# Patient Record
Sex: Male | Born: 1955 | Race: White | Hispanic: No | Marital: Married | Smoking: Never smoker
Health system: Southern US, Community
[De-identification: ages and names within clinical notes are randomized; demographics above are authoritative.]

## PROBLEM LIST (undated history)

## (undated) DIAGNOSIS — K259 Gastric ulcer, unspecified as acute or chronic, without hemorrhage or perforation: Secondary | ICD-10-CM

## (undated) DIAGNOSIS — E785 Hyperlipidemia, unspecified: Secondary | ICD-10-CM

## (undated) DIAGNOSIS — K449 Diaphragmatic hernia without obstruction or gangrene: Secondary | ICD-10-CM

## (undated) DIAGNOSIS — I10 Essential (primary) hypertension: Secondary | ICD-10-CM

## (undated) DIAGNOSIS — I471 Supraventricular tachycardia, unspecified: Secondary | ICD-10-CM

## (undated) DIAGNOSIS — M1990 Unspecified osteoarthritis, unspecified site: Secondary | ICD-10-CM

## (undated) DIAGNOSIS — I4891 Unspecified atrial fibrillation: Secondary | ICD-10-CM

## (undated) DIAGNOSIS — F419 Anxiety disorder, unspecified: Secondary | ICD-10-CM

## (undated) DIAGNOSIS — R002 Palpitations: Secondary | ICD-10-CM

## (undated) DIAGNOSIS — Z8619 Personal history of other infectious and parasitic diseases: Secondary | ICD-10-CM

## (undated) DIAGNOSIS — K5792 Diverticulitis of intestine, part unspecified, without perforation or abscess without bleeding: Secondary | ICD-10-CM

## (undated) HISTORY — DX: Unspecified atrial fibrillation: I48.91

## (undated) HISTORY — DX: Gastric ulcer, unspecified as acute or chronic, without hemorrhage or perforation: K25.9

## (undated) HISTORY — DX: Essential (primary) hypertension: I10

## (undated) HISTORY — DX: Diverticulitis of intestine, part unspecified, without perforation or abscess without bleeding: K57.92

## (undated) HISTORY — DX: Anxiety disorder, unspecified: F41.9

## (undated) HISTORY — DX: Hyperlipidemia, unspecified: E78.5

## (undated) HISTORY — PX: SHOULDER SURGERY: SHX246

## (undated) HISTORY — DX: Palpitations: R00.2

## (undated) HISTORY — DX: Diaphragmatic hernia without obstruction or gangrene: K44.9

## (undated) HISTORY — DX: Supraventricular tachycardia: I47.1

## (undated) HISTORY — DX: Supraventricular tachycardia, unspecified: I47.10

## (undated) HISTORY — DX: Unspecified osteoarthritis, unspecified site: M19.90

## (undated) HISTORY — DX: Personal history of other infectious and parasitic diseases: Z86.19

## (undated) HISTORY — PX: KNEE SURGERY: SHX244

## (undated) HISTORY — PX: SPLENECTOMY: SUR1306

---

## 1999-04-12 ENCOUNTER — Emergency Department (HOSPITAL_COMMUNITY): Admission: EM | Admit: 1999-04-12 | Discharge: 1999-04-12 | Payer: Self-pay | Admitting: Emergency Medicine

## 2004-09-04 ENCOUNTER — Emergency Department (HOSPITAL_COMMUNITY): Admission: EM | Admit: 2004-09-04 | Discharge: 2004-09-05 | Payer: Self-pay | Admitting: Emergency Medicine

## 2010-03-16 ENCOUNTER — Ambulatory Visit: Payer: Self-pay | Admitting: Cardiology

## 2010-03-27 ENCOUNTER — Ambulatory Visit: Payer: Self-pay | Admitting: Cardiology

## 2010-03-27 ENCOUNTER — Ambulatory Visit (HOSPITAL_COMMUNITY): Admission: RE | Admit: 2010-03-27 | Discharge: 2010-03-27 | Payer: Self-pay | Admitting: Cardiology

## 2010-04-17 ENCOUNTER — Ambulatory Visit: Payer: Self-pay | Admitting: Cardiology

## 2010-05-28 ENCOUNTER — Encounter
Admission: RE | Admit: 2010-05-28 | Discharge: 2010-05-28 | Payer: Self-pay | Admitting: Physical Medicine and Rehabilitation

## 2010-10-21 ENCOUNTER — Other Ambulatory Visit: Payer: Self-pay | Admitting: Cardiology

## 2010-10-21 DIAGNOSIS — I471 Supraventricular tachycardia: Secondary | ICD-10-CM

## 2010-10-22 NOTE — Telephone Encounter (Signed)
escribe medication per fax request  

## 2011-03-01 ENCOUNTER — Encounter: Payer: Self-pay | Admitting: *Deleted

## 2011-03-01 DIAGNOSIS — R002 Palpitations: Secondary | ICD-10-CM | POA: Insufficient documentation

## 2011-03-01 DIAGNOSIS — M1990 Unspecified osteoarthritis, unspecified site: Secondary | ICD-10-CM | POA: Insufficient documentation

## 2011-03-01 DIAGNOSIS — Z8619 Personal history of other infectious and parasitic diseases: Secondary | ICD-10-CM | POA: Insufficient documentation

## 2011-03-01 DIAGNOSIS — K449 Diaphragmatic hernia without obstruction or gangrene: Secondary | ICD-10-CM | POA: Insufficient documentation

## 2011-03-01 DIAGNOSIS — K5792 Diverticulitis of intestine, part unspecified, without perforation or abscess without bleeding: Secondary | ICD-10-CM | POA: Insufficient documentation

## 2011-03-01 DIAGNOSIS — I471 Supraventricular tachycardia: Secondary | ICD-10-CM | POA: Insufficient documentation

## 2011-03-15 ENCOUNTER — Encounter: Payer: Self-pay | Admitting: Cardiology

## 2011-03-15 ENCOUNTER — Ambulatory Visit (INDEPENDENT_AMBULATORY_CARE_PROVIDER_SITE_OTHER): Payer: 59 | Admitting: Cardiology

## 2011-03-15 VITALS — BP 134/82 | HR 60 | Ht 67.0 in | Wt 157.6 lb

## 2011-03-15 DIAGNOSIS — I471 Supraventricular tachycardia: Secondary | ICD-10-CM

## 2011-03-15 DIAGNOSIS — R002 Palpitations: Secondary | ICD-10-CM

## 2011-03-15 DIAGNOSIS — I498 Other specified cardiac arrhythmias: Secondary | ICD-10-CM

## 2011-03-15 MED ORDER — METOPROLOL SUCCINATE ER 25 MG PO TB24: 25.0000 mg | ORAL_TABLET | Freq: Every day | ORAL | Status: DC

## 2011-03-15 NOTE — Progress Notes (Signed)
   Adrian Rose Date of Birth: 1956/04/03   History of Present Illness: Adrian Rose is seen for yearly followup. He has made significant lifestyle modifications this year. He has been walking 1-1/2 miles per day in 30 minutes. He is eating much better. He has lost 13 pounds. His blood pressure at home has been under excellent control. He still notes some palpitations but has had no significant tachycardia.  Current Outpatient Prescriptions on File Prior to Visit  Medication Sig Dispense Refill  . ALPRAZolam (XANAX) 0.25 MG tablet Take 0.25 mg by mouth at bedtime as needed.        . Chlorpheniramine Maleate (ALLERGY RELIEF PO) Take by mouth.        Marland Kitchen GLUCOSAMINE PO Take by mouth 2 (two) times daily.        . Multiple Vitamin (MULTIVITAMIN) tablet Take 1 tablet by mouth daily.        Marland Kitchen OMEPRAZOLE PO Take by mouth. OTC daily       . zolpidem (AMBIEN) 10 MG tablet Take 10 mg by mouth at bedtime as needed.        Marland Kitchen DISCONTD: metoprolol succinate (TOPROL-XL) 25 MG 24 hr tablet TAKE 1 TABLET DAILY.  30 tablet  5    No Known Allergies  Past Medical History  Diagnosis Date  . SVT (supraventricular tachycardia)     hx of  . Diverticulitis   . Hiatal hernia   . Arthritis   . History of scarlet fever   . Palpitations     Past Surgical History  Procedure Date  . Knee surgery     arthroscopic left knee  . Shoulder surgery     bilateral  . Splenectomy     History  Smoking status  . Never Smoker   Smokeless tobacco  . Not on file    History  Alcohol Use No    Family History  Problem Relation Age of Onset  . Hypertension Father     Review of Systems: As per history of present illness.  All other systems were reviewed and are negative.  Physical Exam: BP 134/82  Pulse 60  Ht 5\' 7"  (1.702 m)  Wt 157 lb 9.6 oz (71.487 kg)  BMI 24.68 kg/m2 The patient is alert and oriented x 3.  The mood and affect are normal.  The skin is warm and dry.  Color is normal.  The HEENT exam  reveals that the sclera are nonicteric.  The mucous membranes are moist.  The carotids are 2+ without bruits.  There is no thyromegaly.  There is no JVD.  The lungs are clear.  The chest wall is non tender.  The heart exam reveals a regular rate with a normal S1 and S2.  There are no murmurs, gallops, or rubs.  The PMI is not displaced.   Abdominal exam reveals good bowel sounds.  There is no guarding or rebound.  There is no hepatosplenomegaly or tenderness.  There are no masses.  Exam of the legs reveal no clubbing, cyanosis, or edema.  The legs are without rashes.  The distal pulses are intact.  Cranial nerves II - XII are intact.  Motor and sensory functions are intact.  The gait is normal.  LABORATORY DATA:   Assessment / Plan:

## 2011-03-15 NOTE — Patient Instructions (Signed)
Continue your current therapy  I will see you again in 1 year.   

## 2011-03-15 NOTE — Assessment & Plan Note (Signed)
His tachycardia is well controlled on low-dose metoprolol. He still has some palpitations consistent with PACs or PVCs. He has made significant lifestyle modifications. We will continue with his current therapy and followup again in one year.

## 2011-05-07 ENCOUNTER — Other Ambulatory Visit: Payer: Self-pay | Admitting: Cardiology

## 2011-05-19 ENCOUNTER — Other Ambulatory Visit: Payer: Self-pay | Admitting: Cardiology

## 2011-11-13 ENCOUNTER — Other Ambulatory Visit: Payer: Self-pay | Admitting: Cardiology

## 2011-11-13 MED ORDER — METOPROLOL SUCCINATE ER 25 MG PO TB24: 25.0000 mg | ORAL_TABLET | Freq: Every day | ORAL | Status: DC

## 2011-11-27 ENCOUNTER — Telehealth: Payer: Self-pay

## 2011-11-27 MED ORDER — METOPROLOL SUCCINATE ER 25 MG PO TB24: 25.0000 mg | ORAL_TABLET | Freq: Every day | ORAL | Status: DC

## 2011-11-27 NOTE — Telephone Encounter (Signed)
Patient called was told received fax from [facility] [address] [facility] Levan to refill metoprolol.Prescription sent in for a 90 day supply with 3 refills.

## 2012-05-12 ENCOUNTER — Other Ambulatory Visit: Payer: Self-pay | Admitting: *Deleted

## 2012-05-12 MED ORDER — METOPROLOL SUCCINATE ER 25 MG PO TB24: 25.0000 mg | ORAL_TABLET | Freq: Every day | ORAL | Status: DC

## 2012-07-01 HISTORY — PX: EAR CANALOPLASTY: SHX1481

## 2012-12-06 ENCOUNTER — Other Ambulatory Visit: Payer: Self-pay | Admitting: Cardiology

## 2013-02-07 ENCOUNTER — Other Ambulatory Visit: Payer: Self-pay | Admitting: Cardiology

## 2013-03-07 ENCOUNTER — Other Ambulatory Visit: Payer: Self-pay | Admitting: Cardiology

## 2013-04-07 ENCOUNTER — Other Ambulatory Visit: Payer: Self-pay | Admitting: Cardiology

## 2013-04-19 ENCOUNTER — Other Ambulatory Visit: Payer: Self-pay | Admitting: *Deleted

## 2013-04-19 MED ORDER — METOPROLOL SUCCINATE ER 25 MG PO TB24: ORAL_TABLET | ORAL | Status: DC

## 2013-05-14 ENCOUNTER — Other Ambulatory Visit: Payer: Self-pay

## 2013-05-14 ENCOUNTER — Encounter (INDEPENDENT_AMBULATORY_CARE_PROVIDER_SITE_OTHER): Payer: Self-pay

## 2013-05-14 ENCOUNTER — Encounter: Payer: Self-pay | Admitting: Cardiology

## 2013-05-14 ENCOUNTER — Ambulatory Visit (INDEPENDENT_AMBULATORY_CARE_PROVIDER_SITE_OTHER): Payer: BC Managed Care – PPO | Admitting: Cardiology

## 2013-05-14 VITALS — BP 126/72 | HR 57 | Ht 67.0 in | Wt 158.0 lb

## 2013-05-14 DIAGNOSIS — I498 Other specified cardiac arrhythmias: Secondary | ICD-10-CM

## 2013-05-14 DIAGNOSIS — I471 Supraventricular tachycardia: Secondary | ICD-10-CM

## 2013-05-14 MED ORDER — METOPROLOL SUCCINATE ER 25 MG PO TB24: ORAL_TABLET | ORAL | Status: DC

## 2013-05-14 NOTE — Patient Instructions (Signed)
Continue your current therapy  I will see you in one year   

## 2013-05-14 NOTE — Progress Notes (Signed)
   Adrian Rose Date of Birth: 04-23-1956 Medical Record #[id number]  History of Present Illness: Adrian Rose is seen today for followup. He has a history of supraventricular tachycardia. This has been managed with beta blocker therapy. He has done very well over the past year and a half without significant tachycardia. He still experiences palpitations at times at night but these feel more like a skipped beat. He may take an extra half dose of generic metoprolol and settles down within several minutes. He is walking more and has lost 7 pounds.  Current Outpatient Prescriptions on File Prior to Visit  Medication Sig Dispense Refill  . ALPRAZolam (XANAX) 0.25 MG tablet Take 0.25 mg by mouth at bedtime as needed.        . Chlorpheniramine Maleate (ALLERGY RELIEF PO) Take by mouth.        Marland Kitchen GLUCOSAMINE PO Take by mouth 2 (two) times daily.        . metoprolol succinate (TOPROL-XL) 25 MG 24 hr tablet TAKE 1 TABLET EVERY DAY  30 tablet  0  . Multiple Vitamin (MULTIVITAMIN) tablet Take 1 tablet by mouth daily.        Marland Kitchen OMEPRAZOLE PO Take by mouth. OTC daily       . zolpidem (AMBIEN) 10 MG tablet Take 10 mg by mouth at bedtime as needed.         No current facility-administered medications on file prior to visit.    No Known Allergies  Past Medical History  Diagnosis Date  . SVT (supraventricular tachycardia)     hx of  . Diverticulitis   . Hiatal hernia   . Arthritis   . History of scarlet fever   . Palpitations     Past Surgical History  Procedure Laterality Date  . Knee surgery      arthroscopic left knee  . Shoulder surgery      bilateral  . Splenectomy      History  Smoking status  . Never Smoker   Smokeless tobacco  . Not on file    History  Alcohol Use No    Family History  Problem Relation Age of Onset  . Hypertension Father     Review of Systems: The review of systems is positive for chronic knee pain.  All other systems were reviewed and are  negative.  Physical Exam: BP 126/72  Pulse 57  Ht 5\' 7"  (1.702 m)  Wt 158 lb (71.668 kg)  BMI 24.74 kg/m2 He is a pleasant white male in no acute distress. HEENT: Normal Neck: No JVD or bruits. No thyromegaly. Lungs: Clear Cardiovascular: Regular rate and rhythm. Normal S1 and S2. No gallop or murmur. Abdomen: Soft and nontender. Extremities: No edema. Pulses are 2+. Neuro: Alert and oriented x3. Cranial nerves II through XII are intact.  LABORATORY DATA: ECG shows sinus bradycardia with a rate of 57 beats per minute. There is a minor nonspecific ST abnormality.  Laboratory data on 05/07/2013 included normal CBC and complete chemistry panel. Total cholesterol was 184, triglycerides 155, HDL 40, LDL 113.  Assessment / Plan: 1. Supraventricular tachycardia. This is well controlled on low-dose Toprol therapy. We'll continue his current therapy and followup again in one year.

## 2013-07-01 HISTORY — PX: CHOLECYSTECTOMY: SHX55

## 2014-04-29 ENCOUNTER — Ambulatory Visit: Payer: BC Managed Care – PPO | Admitting: Cardiology

## 2014-04-30 ENCOUNTER — Other Ambulatory Visit: Payer: Self-pay

## 2014-04-30 MED ORDER — METOPROLOL SUCCINATE ER 25 MG PO TB24: ORAL_TABLET | ORAL | Status: DC

## 2014-05-04 ENCOUNTER — Encounter: Payer: Self-pay | Admitting: Physician Assistant

## 2014-05-04 ENCOUNTER — Ambulatory Visit: Payer: 59

## 2014-05-04 ENCOUNTER — Ambulatory Visit (INDEPENDENT_AMBULATORY_CARE_PROVIDER_SITE_OTHER): Payer: 59 | Admitting: Physician Assistant

## 2014-05-04 VITALS — BP 110/88 | HR 58 | Ht 67.0 in | Wt 162.1 lb

## 2014-05-04 DIAGNOSIS — I471 Supraventricular tachycardia: Secondary | ICD-10-CM

## 2014-05-04 DIAGNOSIS — Z23 Encounter for immunization: Secondary | ICD-10-CM

## 2014-05-04 DIAGNOSIS — R002 Palpitations: Secondary | ICD-10-CM

## 2014-05-04 MED ORDER — METOPROLOL SUCCINATE ER 25 MG PO TB24: 25.0000 mg | ORAL_TABLET | Freq: Every day | ORAL | Status: DC

## 2014-05-04 NOTE — Patient Instructions (Signed)
Your physician wants you to follow-up in: 1 YEAR WITH DR. JORDAN. You will receive a reminder letter in the mail two months in advance. If you don't receive a letter, please call our office to schedule the follow-up appointment.   NO CHANGES WERE MADE TODAY 

## 2014-05-04 NOTE — Progress Notes (Signed)
Cardiology Office Note   Date:  05/04/2014   ID:  Adrian FastStephen C Kamp, DOB 02/16/1956, MRN [id number]  PCP:   Duane Lopeoss, Alan, MD  Cardiologist:  Dr. Peter SwazilandJordan     History of Present Illness: Adrian Rose is a 58 y.o. male with a history of SVT. This has been controlled on beta blocker therapy. Last seen by Dr. SwazilandJordan 05/2013. He returns for follow-up.  He is doing well. Denies any significant palpitations.  His son was killed in 07/2013.  He has occasional PACs when he is stressed that are relieved with Metoprolol Tartrate 25 mg prn.  The patient denies chest pain, shortness of breath, syncope, orthopnea, PND or significant pedal edema.    Recent Labs/Images:  No results found for requested labs within last 365 days.     Wt Readings from Last 3 Encounters:  05/04/14 162 lb 1.9 oz (73.537 kg)  05/14/13 158 lb (71.668 kg)  03/15/11 157 lb 9.6 oz (71.487 kg)     Past Medical History  Diagnosis Date  . SVT (supraventricular tachycardia)     hx of  . Diverticulitis   . Hiatal hernia   . Arthritis   . History of scarlet fever   . Palpitations     Current Outpatient Prescriptions  Medication Sig Dispense Refill  . ALPRAZolam (XANAX) 0.25 MG tablet Take 0.25 mg by mouth at bedtime as needed.      . Chlorpheniramine Maleate (ALLERGY RELIEF PO) Take by mouth.      Marland Kitchen. GLUCOSAMINE PO Take by mouth 2 (two) times daily.      . metoprolol succinate (TOPROL-XL) 25 MG 24 hr tablet Take 25 mg by mouth daily. For SVT    . Multiple Vitamin (MULTIVITAMIN) tablet Take 1 tablet by mouth daily.      Marland Kitchen. OMEPRAZOLE PO Take by mouth. OTC daily     . zolpidem (AMBIEN) 10 MG tablet Take 10 mg by mouth at bedtime as needed.       No current facility-administered medications for this visit.     Allergies:   Review of patient's allergies indicates no known allergies.   Social History:  The patient  reports that he has never smoked. He does not have any smokeless tobacco history on file. He  reports that he does not drink alcohol. Works as a Scientist, physiologicalN case manager.  Family History:  The patient's family history includes Hypertension in his father.   ROS:  Please see the history of present illness.      All other systems reviewed and negative.    PHYSICAL EXAM: VS:  BP 110/88 mmHg  Pulse 58  Ht 5\' 7"  (1.702 m)  Wt 162 lb 1.9 oz (73.537 kg)  BMI 25.39 kg/m2 Well nourished, well developed, in no acute distress HEENT: normal Neck: no JVD Cardiac:  normal S1, S2; RRR; no murmur Lungs:  clear to auscultation bilaterally, no wheezing, rhonchi or rales Abd: soft, nontender, no hepatomegaly Ext: no edema Skin: warm and dry Neuro:  CNs 2-12 intact, no focal abnormalities noted  EKG:  Sinus bradycardia, HR 58, normal axis, no ST changes      ASSESSMENT AND PLAN:  1.  PSVT (paroxysmal supraventricular tachycardia):  Stable on current regimen.  Continue Toprol-XL 25 mg and extra Metoprolol Tartrate 25 mg as needed for increased palpitations.    Disposition:   FU with Dr. Peter SwazilandJordan 1 year.    Signed, Tereso NewcomerScott Michaelah Credeur, PA-C, MHS 05/04/2014 11:38 AM  [facility] [place], [place], [place]  [address] Phone: [phone]; Fax: [phone]

## 2014-11-27 DIAGNOSIS — K219 Gastro-esophageal reflux disease without esophagitis: Secondary | ICD-10-CM

## 2014-11-27 DIAGNOSIS — I480 Paroxysmal atrial fibrillation: Secondary | ICD-10-CM

## 2014-11-27 HISTORY — DX: Paroxysmal atrial fibrillation: I48.0

## 2014-11-27 HISTORY — DX: Gastro-esophageal reflux disease without esophagitis: K21.9

## 2014-12-05 ENCOUNTER — Ambulatory Visit (INDEPENDENT_AMBULATORY_CARE_PROVIDER_SITE_OTHER): Payer: 59 | Admitting: Cardiology

## 2014-12-05 ENCOUNTER — Encounter: Payer: Self-pay | Admitting: Cardiology

## 2014-12-05 VITALS — BP 122/78 | HR 64 | Ht 67.0 in | Wt 164.7 lb

## 2014-12-05 DIAGNOSIS — I480 Paroxysmal atrial fibrillation: Secondary | ICD-10-CM

## 2014-12-05 DIAGNOSIS — I4891 Unspecified atrial fibrillation: Secondary | ICD-10-CM | POA: Insufficient documentation

## 2014-12-05 DIAGNOSIS — I471 Supraventricular tachycardia: Secondary | ICD-10-CM | POA: Diagnosis not present

## 2014-12-05 NOTE — Progress Notes (Signed)
   [name] Date of Birth: 08/07/1955 Medical Record #[id number]  History of Present Illness: Adrian Rose is seen today for followup. He has a history of supraventricular tachycardia. This has been managed with beta blocker therapy. He has done very well until 11/28/14 when he was admitted to [facility] [place] with atrial fibrillation with RVR. He was started on IV cardizem. Converted spontaneously. troponins were negative. Echo was normal. He reports he has been under a lot of stress recently with increased job demands and caring for his father who is now living with him. He is also caring for his grandson and still grieving for his son who died in an accident last year. He was drinking a lot of [facility] [employer] before his arrhythmia. No recurrence since then. He has eliminated caffeine from his diet.   Current Outpatient Prescriptions on File Prior to Visit  Medication Sig Dispense Refill  . ALPRAZolam (XANAX) 0.25 MG tablet Take 0.25 mg by mouth at bedtime as needed.      Marland Kitchen. GLUCOSAMINE PO Take by mouth 2 (two) times daily.      . metoprolol succinate (TOPROL-XL) 25 MG 24 hr tablet Take 1 tablet (25 mg total) by mouth daily. For SVT 30 tablet 11  . Multiple Vitamin (MULTIVITAMIN) tablet Take 1 tablet by mouth daily.      Marland Kitchen. OMEPRAZOLE PO Take by mouth. OTC daily     . zolpidem (AMBIEN) 10 MG tablet Take 10 mg by mouth at bedtime as needed.       No current facility-administered medications on file prior to visit.    No Known Allergies  Past Medical History  Diagnosis Date  . SVT (supraventricular tachycardia)     hx of  . Diverticulitis   . Hiatal hernia   . Arthritis   . History of scarlet fever   . Palpitations   . Atrial fibrillation     Past Surgical History  Procedure Laterality Date  . Knee surgery      arthroscopic left knee  . Shoulder surgery      bilateral  . Splenectomy      History  Smoking status  . Never Smoker   Smokeless tobacco  . Not on file     History  Alcohol Use No    Family History  Problem Relation Age of Onset  . Hypertension Father     Review of Systems: As noted in HPI.   All other systems were reviewed and are negative.  Physical Exam: BP 122/78 mmHg  Pulse 64  Ht 5\' 7"  (1.702 m)  Wt 74.707 kg (164 lb 11.2 oz)  BMI 25.79 kg/m2 He is a pleasant white male in no acute distress. HEENT: Normal Neck: No JVD or bruits. No thyromegaly. Lungs: Clear Cardiovascular: Regular rate and rhythm. Normal S1 and S2. No gallop or murmur. Abdomen: Soft and nontender. Extremities: No edema. Pulses are 2+. Neuro: Alert and oriented x3. Cranial nerves II through XII are intact.  LABORATORY DATA: [facility] reviewed. TSH normal.   Assessment / Plan: 1. History of Supraventricular tachycardia. This is well controlled on low-dose Toprol therapy.   2. Paroxysmal atrial fibrillation with RVR. Multiple stressors. Now converted to NSR. CHAD-Vasc score 0-1. I do not recommend anticoagulation at this point. Will continue metoprolol. Monitor for recurrence. Abstain from caffeine.   I will follow up in 6 months.

## 2014-12-05 NOTE — Patient Instructions (Signed)
Continue your current therapy  Avoid caffeine  I will see you in 6 months.  

## 2015-01-30 DIAGNOSIS — Z8711 Personal history of peptic ulcer disease: Secondary | ICD-10-CM

## 2015-01-30 DIAGNOSIS — Z9889 Other specified postprocedural states: Secondary | ICD-10-CM

## 2015-01-30 HISTORY — DX: Personal history of peptic ulcer disease: Z87.11

## 2015-01-30 HISTORY — DX: Other specified postprocedural states: Z98.890

## 2015-05-29 ENCOUNTER — Other Ambulatory Visit: Payer: Self-pay | Admitting: Physician Assistant

## 2015-07-29 ENCOUNTER — Other Ambulatory Visit: Payer: Self-pay | Admitting: Physician Assistant

## 2015-09-05 ENCOUNTER — Telehealth: Payer: Self-pay | Admitting: Cardiology

## 2015-09-05 NOTE — Telephone Encounter (Signed)
Returned call to patient no answer.Left message on personal voice mail will make a note in chart you have changed cardiologist,

## 2015-09-05 NOTE — Telephone Encounter (Signed)
Patient came by office to state he has changed cardiologists and states he received a refill that was from this office.

## 2015-10-27 ENCOUNTER — Other Ambulatory Visit: Payer: Self-pay | Admitting: Cardiology

## 2015-10-27 NOTE — Telephone Encounter (Signed)
Rx(s) sent to pharmacy electronically.  

## 2015-12-31 ENCOUNTER — Other Ambulatory Visit: Payer: Self-pay | Admitting: Cardiology

## 2016-05-21 DIAGNOSIS — R030 Elevated blood-pressure reading, without diagnosis of hypertension: Secondary | ICD-10-CM

## 2016-05-21 HISTORY — DX: Elevated blood-pressure reading, without diagnosis of hypertension: R03.0

## 2016-06-23 ENCOUNTER — Other Ambulatory Visit: Payer: Self-pay | Admitting: Cardiology

## 2016-06-25 NOTE — Telephone Encounter (Signed)
REFILL 

## 2016-10-15 ENCOUNTER — Ambulatory Visit: Payer: Self-pay | Admitting: Urology

## 2016-11-20 ENCOUNTER — Ambulatory Visit: Payer: Self-pay

## 2016-12-24 ENCOUNTER — Encounter: Payer: Self-pay | Admitting: Urology

## 2016-12-24 ENCOUNTER — Ambulatory Visit (INDEPENDENT_AMBULATORY_CARE_PROVIDER_SITE_OTHER): Payer: 59 | Admitting: Urology

## 2016-12-24 VITALS — BP 150/78 | HR 75 | Ht 67.0 in | Wt 163.0 lb

## 2016-12-24 DIAGNOSIS — N411 Chronic prostatitis: Secondary | ICD-10-CM | POA: Diagnosis not present

## 2016-12-24 DIAGNOSIS — N410 Acute prostatitis: Secondary | ICD-10-CM

## 2016-12-24 DIAGNOSIS — N281 Cyst of kidney, acquired: Secondary | ICD-10-CM

## 2016-12-24 LAB — MICROSCOPIC EXAMINATION
BACTERIA UA: NONE SEEN
EPITHELIAL CELLS (NON RENAL): NONE SEEN /HPF (ref 0–10)

## 2016-12-24 LAB — URINALYSIS, COMPLETE
BILIRUBIN UA: NEGATIVE
Glucose, UA: NEGATIVE
Ketones, UA: NEGATIVE
LEUKOCYTES UA: NEGATIVE
Nitrite, UA: NEGATIVE
PH UA: 5.5 (ref 5.0–7.5)
PROTEIN UA: NEGATIVE
Specific Gravity, UA: 1.02 (ref 1.005–1.030)
Urobilinogen, Ur: 0.2 mg/dL (ref 0.2–1.0)

## 2016-12-24 LAB — BLADDER SCAN AMB NON-IMAGING

## 2016-12-24 NOTE — Progress Notes (Signed)
12/24/2016 10:24 AM   Adrian Rose [birth year] [id number]  Referring provider: Daisy Floro, MD [address] [place], [place] [address]  Chief Complaint  Patient presents with  . New Patient (Initial Visit)    Prostatitis    HPI: This is a patient that was seen in the [place] office and is transferring care. He was seen March 2018 when he complained of dysuria, frequency, low-grade fever, perineal discomfort for several days. He had a post void of 354 mL, UA that showed white cells red cells. Culture was negative. On exam he had left greater than right firm prostate. He was given a shot of Rocephin and sent out on Bactrim. He also had a foley placed which he removed (he is a Engineer, civil (consulting)) and tamsulosin. He had syncope and RGE with tamsulosin and "hated it". All of his symptoms cleared.   He was previously seen Dr. Retta Diones for a Bosniak 2 cyst of 3.1 cm (pt recalls on left) and microscopic hematuria. He had one other episode of prostatitis in past.   Today, patient is seen for the above. All of his symptoms resolved. His PVR is 22 mL today. UA clear. AUASS = 1 (noc x 1).   Modifying factors: There are no other modifying factors  Associated signs and symptoms: There are no other associated signs and symptoms Aggravating and relieving factors: There are no other aggravating or relieving factors Severity: Moderate Duration: Resolved      PMH: Past Medical History:  Diagnosis Date  . Anxiety   . Arthritis   . Atrial fibrillation (HCC)   . Diverticulitis   . Hiatal hernia   . History of scarlet fever   . Hyperlipemia   . Hypertension   . Palpitations   . Stomach ulcer   . SVT (supraventricular tachycardia) (HCC)    hx of    Surgical History: Past Surgical History:  Procedure Laterality Date  . CHOLECYSTECTOMY  2015  . EAR CANALOPLASTY  2014  . KNEE SURGERY     arthroscopic left knee  . SHOULDER SURGERY     bilateral  . SPLENECTOMY      Home  Medications:  Allergies as of 12/24/2016   No Known Allergies     Medication List       Accurate as of 12/24/16 10:24 AM. Always use your most recent med list.          ALPRAZolam 0.25 MG tablet Commonly known as:  XANAX Take 0.25 mg by mouth at bedtime as needed.   cetirizine 10 MG tablet Commonly known as:  ZYRTEC Take 10 mg by mouth daily.   GLUCOSAMINE PO Take by mouth 2 (two) times daily.   metoprolol succinate 25 MG 24 hr tablet Commonly known as:  TOPROL-XL TAKE 1 TABLET BY MOUTH EVERY DAY   multivitamin tablet Take 1 tablet by mouth daily.   OMEPRAZOLE PO Take by mouth. OTC daily   zolpidem 10 MG tablet Commonly known as:  AMBIEN Take 10 mg by mouth at bedtime as needed.       Allergies: No Known Allergies  Family History: Family History  Problem Relation Age of Onset  . Hypertension Father   . Kidney cancer Father   . Lung cancer Father   . Prostate cancer Neg Hx     Social History:  reports that he has never smoked. He has never used smokeless tobacco. He reports that he does not drink alcohol or use drugs.  ROS: UROLOGY Frequent  Urination?: Yes Hard to postpone urination?: Yes Burning/pain with urination?: Yes Get up at night to urinate?: Yes Leakage of urine?: No Urine stream starts and stops?: Yes Trouble starting stream?: Yes Do you have to strain to urinate?: Yes Blood in urine?: No Urinary tract infection?: Yes Sexually transmitted disease?: No Injury to kidneys or bladder?: No Painful intercourse?: No Weak stream?: Yes Erection problems?: No Penile pain?: No  Gastrointestinal Nausea?: No Vomiting?: No Indigestion/heartburn?: No Diarrhea?: No Constipation?: No  Constitutional Fever: Yes Night sweats?: Yes Weight loss?: No Fatigue?: Yes  Skin Skin rash/lesions?: No Itching?: No  Eyes Blurred vision?: No Double vision?: No  Ears/Nose/Throat Sore throat?: No Sinus problems?: No  Hematologic/Lymphatic Swollen  glands?: No Easy bruising?: No  Cardiovascular Leg swelling?: No Chest pain?: No  Respiratory Cough?: No Shortness of breath?: No  Endocrine Excessive thirst?: No  Musculoskeletal Back pain?: No Joint pain?: No  Neurological Headaches?: Yes Dizziness?: No  Psychologic Depression?: No Anxiety?: No  Physical Exam: BP (!) 150/78   Pulse 75   Ht 5\' 7"  (1.702 m)   Wt 73.9 kg (163 lb)   BMI 25.53 kg/m   Constitutional:  Alert and oriented, No acute distress. HEENT: [place] AT, moist mucus membranes.  Trachea midline, no masses. Cardiovascular: No clubbing, cyanosis, or edema. Respiratory: Normal respiratory effort, no increased work of breathing. GI: Abdomen is soft, nontender, nondistended, no abdominal masses GU: No CVA tenderness.  DRE: benign exam today, prostate about 30 grams, smooth, no hard area or nodule  Skin: No rashes, bruises or suspicious lesions. Lymph: No cervical or inguinal adenopathy. Neurologic: Grossly intact, no focal deficits, moving all 4 extremities. Psychiatric: Normal mood and affect.  Laboratory Data: No results found for: WBC, HGB, HCT, MCV, PLT  No results found for: CREATININE  No results found for: PSA  No results found for: TESTOSTERONE  No results found for: HGBA1C  Urinalysis No results found for: COLORURINE, APPEARANCEUR, LABSPEC, PHURINE, GLUCOSEU, HGBUR, BILIRUBINUR, KETONESUR, PROTEINUR, UROBILINOGEN, NITRITE, LEUKOCYTESUR  Pertinent Imaging:  Assessment & Plan:   1. acute prostatitis - resolved. PSA was sent. DRE, PVR, UA all normal. PSA was sent.  2. Complex renal cyst - set up for a renal/bladder US.   - Urinalysis, Complete - BLADDER SCAN AMB NON-IMAGING   No Follow-up on file.  Jerilee FieldESKRIDGE, Rylin Saez, MD  [facility] [address], [address] [place], [place] [address] [phone]

## 2016-12-25 LAB — PSA: PROSTATE SPECIFIC AG, SERUM: 2.9 ng/mL (ref 0.0–4.0)

## 2016-12-30 ENCOUNTER — Telehealth: Payer: Self-pay

## 2016-12-30 NOTE — Telephone Encounter (Signed)
-----   Message from Jerilee FieldMatthew Eskridge, MD sent at 12/28/2016  5:39 PM EDT ----- Notify patient his PSA is normal -- give result    ----- Message ----- From: Ellin GoodieLowe, Casandra S, CMA Sent: 12/25/2016   8:10 AM To: Jerilee FieldMatthew Eskridge, MD    ----- Message ----- From: Interface, [facility] Lab Results In Sent: 12/24/2016   4:39 PM To: [facility]

## 2016-12-30 NOTE — Telephone Encounter (Signed)
Patient notified

## 2017-01-02 ENCOUNTER — Ambulatory Visit
Admission: RE | Admit: 2017-01-02 | Discharge: 2017-01-02 | Disposition: A | Discharge status: Home / Self Care | Payer: 59 | Source: Ambulatory Visit | Attending: Urology | Admitting: Urology

## 2017-01-02 DIAGNOSIS — N281 Cyst of kidney, acquired: Secondary | ICD-10-CM | POA: Insufficient documentation

## 2017-01-08 ENCOUNTER — Telehealth: Payer: Self-pay | Admitting: Family Medicine

## 2017-01-08 NOTE — Telephone Encounter (Signed)
-----   Message from Jerilee FieldMatthew Eskridge, MD sent at 01/05/2017  8:52 AM EDT ----- Notify patient the cyst in the left kidney still has benign / non-cancerous imaging qualities, but it is growing a few mm larger every year, so we might consider getting an MRI in the next 3-6 months to check it more thoroughly than an US. If he's OK with that, schedule for MRI abdomen without and with contrast in about 6 months. Thanks.    ----- Message ----- From: Honor LohGarrison, Malan Werk M, CMA Sent: 01/02/2017   4:35 PM To: Jerilee FieldMatthew Eskridge, MD    ----- Message ----- From: Interface, Rad Results In Sent: 01/02/2017   4:26 PM To: [facility]

## 2017-01-08 NOTE — Telephone Encounter (Signed)
Patient notified and wants to find out what insurance he will have closer to the 6 month mark. He is going to do some research before making the decision. He will contact our office when he is ready to do it.

## 2017-01-08 NOTE — Telephone Encounter (Signed)
Left message on machine for patient to return call

## 2017-02-05 ENCOUNTER — Telehealth: Payer: Self-pay

## 2017-02-05 NOTE — Telephone Encounter (Signed)
Pt called stating he has had prostatitis before and he is starting to develop those s/s again. Pt stated he is currently experiencing dysuria. Pt requested another rx of bactrim. Please advise.

## 2017-02-06 NOTE — Telephone Encounter (Signed)
Best if he comes in to see PA for UA and PVR, but if he can't make it in [place] to send in Bactrim DS, one po BID, #14, no refill

## 2017-02-06 NOTE — Telephone Encounter (Signed)
Spoke with pt in reference to s/s of possible prostatitis. Made pt aware would be best to be seen in clinic. Pt stated that since making original phone call his s/s have gone away. Pt then stated that at this time he does not want to be seen but will call back to make an appt if s/s return.

## 2017-12-25 ENCOUNTER — Ambulatory Visit: Payer: BLUE CROSS/BLUE SHIELD

## 2018-02-13 DIAGNOSIS — R079 Chest pain, unspecified: Secondary | ICD-10-CM

## 2018-02-13 HISTORY — DX: Chest pain, unspecified: R07.9

## 2018-02-19 ENCOUNTER — Other Ambulatory Visit: Payer: Self-pay | Admitting: Otolaryngology

## 2018-02-19 DIAGNOSIS — H7412 Adhesive left middle ear disease: Secondary | ICD-10-CM

## 2018-02-19 DIAGNOSIS — H900 Conductive hearing loss, bilateral: Secondary | ICD-10-CM

## 2018-02-27 ENCOUNTER — Ambulatory Visit
Admission: RE | Admit: 2018-02-27 | Discharge: 2018-02-27 | Disposition: A | Discharge status: Home / Self Care | Payer: 59 | Source: Ambulatory Visit | Attending: Otolaryngology | Admitting: Otolaryngology

## 2018-02-27 ENCOUNTER — Encounter: Payer: Self-pay | Admitting: Radiology

## 2018-02-27 DIAGNOSIS — H900 Conductive hearing loss, bilateral: Secondary | ICD-10-CM

## 2018-02-27 DIAGNOSIS — H7412 Adhesive left middle ear disease: Secondary | ICD-10-CM

## 2018-02-27 MED ORDER — IOHEXOL 300 MG/ML  SOLN
75.0000 mL | Freq: Once | INTRAMUSCULAR | Status: AC | PRN
  Administered 2018-02-27: 75 mL via INTRAVENOUS

## 2019-04-19 ENCOUNTER — Observation Stay (HOSPITAL_COMMUNITY)
Admission: EM | Admit: 2019-04-19 | Discharge: 2019-04-20 | Disposition: A | Discharge status: Home / Self Care | Payer: No Typology Code available for payment source | Attending: Internal Medicine | Admitting: Internal Medicine

## 2019-04-19 ENCOUNTER — Emergency Department (HOSPITAL_COMMUNITY): Payer: No Typology Code available for payment source

## 2019-04-19 ENCOUNTER — Encounter (HOSPITAL_COMMUNITY): Payer: Self-pay | Admitting: Emergency Medicine

## 2019-04-19 DIAGNOSIS — Z20828 Contact with and (suspected) exposure to other viral communicable diseases: Secondary | ICD-10-CM | POA: Diagnosis not present

## 2019-04-19 DIAGNOSIS — H90A12 Conductive hearing loss, unilateral, left ear with restricted hearing on the contralateral side: Secondary | ICD-10-CM | POA: Insufficient documentation

## 2019-04-19 DIAGNOSIS — E785 Hyperlipidemia, unspecified: Secondary | ICD-10-CM | POA: Diagnosis not present

## 2019-04-19 DIAGNOSIS — I4891 Unspecified atrial fibrillation: Secondary | ICD-10-CM

## 2019-04-19 DIAGNOSIS — F419 Anxiety disorder, unspecified: Secondary | ICD-10-CM | POA: Insufficient documentation

## 2019-04-19 DIAGNOSIS — I480 Paroxysmal atrial fibrillation: Principal | ICD-10-CM | POA: Insufficient documentation

## 2019-04-19 DIAGNOSIS — Z7982 Long term (current) use of aspirin: Secondary | ICD-10-CM | POA: Diagnosis not present

## 2019-04-19 DIAGNOSIS — Z8249 Family history of ischemic heart disease and other diseases of the circulatory system: Secondary | ICD-10-CM | POA: Insufficient documentation

## 2019-04-19 DIAGNOSIS — M1990 Unspecified osteoarthritis, unspecified site: Secondary | ICD-10-CM | POA: Diagnosis not present

## 2019-04-19 DIAGNOSIS — I471 Supraventricular tachycardia: Secondary | ICD-10-CM | POA: Insufficient documentation

## 2019-04-19 DIAGNOSIS — H6993 Unspecified Eustachian tube disorder, bilateral: Secondary | ICD-10-CM

## 2019-04-19 DIAGNOSIS — I10 Essential (primary) hypertension: Secondary | ICD-10-CM

## 2019-04-19 DIAGNOSIS — R197 Diarrhea, unspecified: Secondary | ICD-10-CM | POA: Diagnosis not present

## 2019-04-19 DIAGNOSIS — D72829 Elevated white blood cell count, unspecified: Secondary | ICD-10-CM | POA: Insufficient documentation

## 2019-04-19 DIAGNOSIS — Z79899 Other long term (current) drug therapy: Secondary | ICD-10-CM | POA: Insufficient documentation

## 2019-04-19 DIAGNOSIS — Z7951 Long term (current) use of inhaled steroids: Secondary | ICD-10-CM | POA: Insufficient documentation

## 2019-04-19 DIAGNOSIS — R002 Palpitations: Secondary | ICD-10-CM | POA: Diagnosis not present

## 2019-04-19 HISTORY — DX: Unspecified atrial fibrillation: I48.91

## 2019-04-19 HISTORY — DX: Unspecified eustachian tube disorder, bilateral: H69.93

## 2019-04-19 HISTORY — DX: Conductive hearing loss, unilateral, left ear with restricted hearing on the contralateral side: H90.A12

## 2019-04-19 HISTORY — DX: Elevated white blood cell count, unspecified: D72.829

## 2019-04-19 LAB — TROPONIN I (HIGH SENSITIVITY): Troponin I (High Sensitivity): 21 ng/L — ABNORMAL HIGH (ref <18)

## 2019-04-19 LAB — BASIC METABOLIC PANEL
Anion gap: 10 (ref 5–15)
BUN: 10 mg/dL (ref 6–20)
CO2: 25 mmol/L (ref 22–32)
Calcium: 9.1 mg/dL (ref 8.9–10.3)
Chloride: 106 mmol/L (ref 98–111)
Creatinine, Ser: 0.88 mg/dL (ref 0.61–1.24)
GFR calc Af Amer: >60 mL/min (ref >60)
GFR calc non Af Amer: >60 mL/min (ref >60)
Glucose, Bld: 125 mg/dL — ABNORMAL HIGH (ref 70–99)
Potassium: 3.5 mmol/L (ref 3.5–5.1)
Sodium: 141 mmol/L (ref 135–145)

## 2019-04-19 LAB — CBC
HCT: 44.5 % (ref 39.0–52.0)
Hemoglobin: 14.4 g/dL (ref 13.0–17.0)
MCH: 30.9 pg (ref 26.0–34.0)
MCHC: 32.4 g/dL (ref 30.0–36.0)
MCV: 95.5 fL (ref 80.0–100.0)
Platelets: 502 10*3/uL — ABNORMAL HIGH (ref 150–400)
RBC: 4.66 MIL/uL (ref 4.22–5.81)
RDW: 13.8 % (ref 11.5–15.5)
WBC: 12.7 10*3/uL — ABNORMAL HIGH (ref 4.0–10.5)
nRBC: 0 % (ref 0.0–0.2)

## 2019-04-19 LAB — URINALYSIS, ROUTINE W REFLEX MICROSCOPIC
Bacteria, UA: NONE SEEN
Bilirubin Urine: NEGATIVE
Glucose, UA: 50 mg/dL — AB
Ketones, ur: NEGATIVE mg/dL
Leukocytes,Ua: NEGATIVE
Nitrite: NEGATIVE
Protein, ur: NEGATIVE mg/dL
Specific Gravity, Urine: 1.003 — ABNORMAL LOW (ref 1.005–1.030)
pH: 8 (ref 5.0–8.0)

## 2019-04-19 LAB — D-DIMER, QUANTITATIVE: D-Dimer, Quant: 0.39 ug/mL-FEU (ref 0.00–0.50)

## 2019-04-19 MED ORDER — DILTIAZEM HCL-DEXTROSE 125-5 MG/125ML-% IV SOLN (PREMIX)
5.0000 mg/h | INTRAVENOUS | Status: DC
  Administered 2019-04-19: 20:00:00 5 mg/h via INTRAVENOUS
  Filled 2019-04-19: qty 125

## 2019-04-19 MED ORDER — DILTIAZEM LOAD VIA INFUSION
10.0000 mg | Freq: Once | INTRAVENOUS | Status: DC
  Filled 2019-04-19: qty 10

## 2019-04-19 MED ORDER — SODIUM CHLORIDE 0.9% FLUSH: 3.0000 mL | Freq: Once | INTRAVENOUS | Status: DC

## 2019-04-19 MED ORDER — METOPROLOL TARTRATE 5 MG/5ML IV SOLN
5.0000 mg | Freq: Once | INTRAVENOUS | Status: AC
  Administered 2019-04-19: 5 mg via INTRAVENOUS
  Filled 2019-04-19: qty 5

## 2019-04-19 MED ORDER — SODIUM CHLORIDE 0.9 % IV BOLUS
1000.0000 mL | Freq: Once | INTRAVENOUS | Status: AC
  Administered 2019-04-19: 19:00:00 1000 mL via INTRAVENOUS

## 2019-04-19 NOTE — ED Triage Notes (Signed)
Patient here from home with complaints of palpitations. Hx of SVT and AFib. Reports symptoms started at 3pm. No blood thinners. Metoprolol.

## 2019-04-19 NOTE — H&P (Signed)
Adrian Rose DGU:[id number] DOB: 11-04-1955 DOA: 04/19/2019     PCP: Lawerance Cruel, MD   Outpatient Specialists:  CARDS:   Dr. Bernie Covey  used to see [facility] Dr. Earmon Phoenix  Patient arrived to ER on 04/19/19 at 1632  Patient coming from: home Lives   With family    Chief Complaint:   Chief Complaint  Patient presents with  . Palpitations    HPI: Adrian Rose is a 63 y.o. male with medical history significant of paroxysmal atrial fibrillation, SVT, essential hypertension, chest pain, and anxiety    Presented with   palpitations started at home around 3 PM. It felt similar to prior A.fib Last week he had campylobacter infection with significant diarrhea, he had only clear liquids for about a week.  Has history of atrial fibrillation since 2016 at the time taken converted with IV Cardizem and has been on Toprol since.  Not anticoagulated CHA2DS2-VASc score 0 Patient followed by cardiology last time was seen about 40 months ago had a stress test done which was negative Today he took extra 25 mg of metoprolol and baby Aspirin seem to have improved his palpitations but he continued to have sensation of irregular heartbeat Fevers no chills no cough no shortness of breath no nausea no vomiting.  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 Fib -  - CHA2DS2 vas score 0:  current  Not on anticoagulation         -  Rate control:  Currently controlled with Toprolol,    Urinary tract infection last year treated with Levaquin  While in ER:  The following Work up has been ordered so far:  Orders Placed This Encounter  Procedures  . Critical Care  . SARS CORONAVIRUS 2 (TAT 6-24 HRS) Nasopharyngeal  Nasopharyngeal Swab  . DG Chest 2 View  . Basic metabolic panel  . CBC  . Urinalysis, Routine w reflex microscopic  . Cardiac monitoring  . Saline Lock IV, Maintain IV access  . If O2 Sat <94% administer O2 at 2 liters/minute via nasal cannula  . Consult to hospitalist  ALL PATIENTS BEING ADMITTED/HAVING PROCEDURES NEED COVID-19 SCREENING  . Airborne and Contact precautions  . Pulse oximetry, continuous  . EKG 12-Lead  . ED EKG     Following Medications were ordered in ER: Medications  sodium chloride flush (NS) 0.9 % injection 3 mL (0 mLs Intravenous Hold 04/19/19 1852)  diltiazem (CARDIZEM) 1 mg/mL load via infusion 10 mg (has no administration in time range)    And  diltiazem (CARDIZEM) 125 mg in dextrose 5% 125 mL (1 mg/mL) infusion (7.5 mg/hr Intravenous Rate/Dose Change 04/19/19 2124)  sodium chloride 0.9 % bolus 1,000 mL (0 mLs Intravenous Stopped 04/19/19 2012)  metoprolol tartrate (LOPRESSOR) injection 5 mg (5 mg Intravenous Given 04/19/19 1856)        Consult Orders  (From admission, onward)         Start     Ordered   04/19/19 2059  Consult to hospitalist  ALL PATIENTS BEING ADMITTED/HAVING PROCEDURES NEED COVID-19 SCREENING  Once    Comments: ALL PATIENTS BEING ADMITTED/HAVING PROCEDURES NEED COVID-19 SCREENING  Provider:  (Not yet assigned)  Question Answer Comment  Place call to: [facility]   Reason for Consult Admit      04/19/19 2058          Significant initial  Findings: Abnormal Labs Reviewed  BASIC METABOLIC PANEL - Abnormal; Notable for the following components:      Result Value   Glucose, Bld 125 (*)    All other components within normal limits  CBC - Abnormal; Notable for the following components:   WBC 12.7 (*)    Platelets 502 (*)    All other components within normal limits  URINALYSIS, ROUTINE W REFLEX MICROSCOPIC - Abnormal; Notable for the following components:   Color, Urine COLORLESS (*)    Specific Gravity, Urine 1.003  (*)    Glucose, UA 50 (*)    Hgb urine dipstick SMALL (*)    All other components within normal limits     Otherwise labs showing:    Recent Labs  Lab 04/19/19 1707  NA 141  K 3.5  CO2 25  GLUCOSE 125*  BUN 10  CREATININE 0.88  CALCIUM 9.1    Cr  stable,  Up from baseline see below Lab Results  Component Value Date   CREATININE 0.88 04/19/2019    No results for input(s): AST, ALT, ALKPHOS, BILITOT, PROT, ALBUMIN in the last 168 hours. Lab Results  Component Value Date   CALCIUM 9.1 04/19/2019      WBC      Component Value Date/Time   WBC 12.7 (H) 04/19/2019 1707   ANC No results found for: NEUTROABS ALC No components found for: LYMPHAB   Plt: Lab Results  Component Value Date   PLT 502 (H) 04/19/2019      COVID-19 Labs  No results for input(s): DDIMER, FERRITIN, LDH, CRP in the last 72 hours.  No results found for: SARSCOV2NAA   HG/HCT   stable,       Component Value Date/Time   HGB 14.4 04/19/2019 1707   HCT 44.5 04/19/2019 1707        ECG: Ordered Personally reviewed by me showing: HR : 122 Rhythm:  A.fib. W RVR,    no evidence of ischemic changes QTC 423      UA   no evidence of UTI      Urine analysis:    Component Value Date/Time   COLORURINE COLORLESS (A) 04/19/2019 1847   APPEARANCEUR CLEAR 04/19/2019 1847   APPEARANCEUR Clear 12/24/2016 1010   LABSPEC 1.003 (L) 04/19/2019 1847   PHURINE 8.0 04/19/2019 1847   GLUCOSEU 50 (A) 04/19/2019 1847   HGBUR SMALL (A) 04/19/2019 1847   BILIRUBINUR NEGATIVE 04/19/2019 1847   BILIRUBINUR Negative 12/24/2016 1010   KETONESUR NEGATIVE 04/19/2019 1847   PROTEINUR NEGATIVE 04/19/2019 1847   NITRITE NEGATIVE 04/19/2019 1847   LEUKOCYTESUR NEGATIVE 04/19/2019 1847      Ordered    CXR - NON acute      ED Triage Vitals  Enc Vitals Group     BP 04/19/19 1730 (!) 123/99     Pulse Rate 04/19/19 1730 (!) 106     Resp 04/19/19 1730 19     Temp --      Temp src --      SpO2 04/19/19  1730 99 %     Weight --      Height --      Head Circumference --      Peak Flow --      Pain Score 04/19/19 1642 0  Pain Loc --      Pain Edu? --      Excl. in GC? --   TMAX(24)@       Latest  Blood pressure (!) 118/93, pulse (!) 117, resp. rate 17, SpO2 97 %.    Hospitalist was called for admission for a.fib w rvr   Review of Systems:    Pertinent positives include: palpitations  Constitutional:  No weight loss, night sweats, Fevers, chills, fatigue, weight loss  HEENT:  No headaches, Difficulty swallowing,Tooth/dental problems,Sore throat,  No sneezing, itching, ear ache, nasal congestion, post nasal drip,  Cardio-vascular:  No chest pain, Orthopnea, PND, anasarca, dizziness, palpitations.no Bilateral lower extremity swelling  GI:  No heartburn, indigestion, abdominal pain, nausea, vomiting, diarrhea, change in bowel habits, loss of appetite, melena, blood in stool, hematemesis Resp:  no shortness of breath at rest. No dyspnea on exertion, No excess mucus, no productive cough, No non-productive cough, No coughing up of blood.No change in color of mucus.No wheezing. Skin:  no rash or lesions. No jaundice GU:  no dysuria, change in color of urine, no urgency or frequency. No straining to urinate.  No flank pain.  Musculoskeletal:  No joint pain or no joint swelling. No decreased range of motion. No back pain.  Psych:  No change in mood or affect. No depression or anxiety. No memory loss.  Neuro: no localizing neurological complaints, no tingling, no weakness, no double vision, no gait abnormality, no slurred speech, no confusion  All systems reviewed and apart from HOPI all are negative  Past Medical History:   Past Medical History:  Diagnosis Date  . Anxiety   . Arthritis   . Atrial fibrillation (HCC)   . Diverticulitis   . Hiatal hernia   . History of scarlet fever   . Hyperlipemia   . Hypertension   . Palpitations   . Stomach ulcer   . SVT  (supraventricular tachycardia) (HCC)    hx of      Past Surgical History:  Procedure Laterality Date  . CHOLECYSTECTOMY  2015  . EAR CANALOPLASTY  2014  . KNEE SURGERY     arthroscopic left knee  . SHOULDER SURGERY     bilateral  . SPLENECTOMY      Social History:  Ambulatory   independently      reports that he has never smoked. He has never used smokeless tobacco. He reports that he does not drink alcohol or use drugs.    Family History:   Family History  Problem Relation Age of Onset  . Hypertension Father   . Kidney cancer Father   . Lung cancer Father   . Prostate cancer Neg Hx     Allergies: Allergies  Allergen Reactions  . Azithromycin Diarrhea     Prior to Admission medications   Medication Sig Start Date End Date Taking? Authorizing Provider  ALPRAZolam (XANAX) 0.25 MG tablet Take 0.125 mg by mouth at bedtime as needed.    Yes [provider]  aspirin 81 MG EC tablet Take 81 mg by mouth daily.   Yes [provider]  fluticasone (FLONASE) 50 MCG/ACT nasal spray Place 1 spray into the nose 2 (two) times daily.   Yes [provider]  Lactobacillus Rhamnosus, GG, (CULTURELLE) CAPS Take 1 capsule by mouth daily.   Yes [provider]  losartan (COZAAR) 50 MG tablet Take 50 mg by mouth daily.   Yes [provider]  metoprolol succinate (TOPROL-XL) 25 MG 24 hr  tablet TAKE 1 TABLET BY MOUTH EVERY DAY Patient taking differently: Take 25 mg by mouth daily.  06/25/16  Yes [place], Adrian M, MD  metoprolol tartrate (LOPRESSOR) 50 MG tablet Take 25 mg by mouth daily as needed.   Yes [provider]  Multiple Vitamin (MULTIVITAMIN) tablet Take 1 tablet by mouth daily.     Yes [provider]  Omeprazole 20 MG TBEC Take 20 mg by mouth daily. OTC daily    Yes [provider]  zolpidem (AMBIEN) 10 MG tablet Take 10 mg by mouth at bedtime as needed.     Yes [provider]   Physical Exam:  Blood pressure (!) 118/93, pulse (!) 117, resp. rate 17, SpO2 97 %. 1. General:  in No  Acute distress   Chronically ill   -appearing 2. Psychological: Alert and   Oriented 3. Head/ENT:  Dry Mucous Membranes                          Head Non traumatic, neck supple                           Poor Dentition 4. SKIN:  decreased Skin turgor,  Skin clean Dry and intact no rash 5. Heart: Regular rate and rhythm no Murmur, no Rub or gallop 6. Lungs:  no wheezes or crackles   7. Abdomen: Soft,  non-tender,  bowel sounds present 8. Lower extremities: no clubbing, cyanosis, no  edema 9. Neurologically Grossly intact, moving all 4 extremities equally  10. MSK: Normal range of motion   All other LABS:     Recent Labs  Lab 04/19/19 1707  WBC 12.7*  HGB 14.4  HCT 44.5  MCV 95.5  PLT 502*     Recent Labs  Lab 04/19/19 1707  NA 141  K 3.5  CL 106  CO2 25  GLUCOSE 125*  BUN 10  CREATININE 0.88  CALCIUM 9.1     No results for input(s): AST, ALT, ALKPHOS, BILITOT, PROT, ALBUMIN in the last 168 hours.     Cultures: No results found for: SDES, SPECREQUEST, CULT, REPTSTATUS   Radiological Exams on Admission: Dg Chest 2 View  Result Date: 04/19/2019 CLINICAL DATA:  Palpitations. EXAM: CHEST - 2 VIEW COMPARISON:  February 05, 2018. FINDINGS: The heart size and mediastinal contours are within normal limits. Both lungs are clear. No pneumothorax or pleural effusion is noted. The visualized skeletal structures are unremarkable. IMPRESSION: No active cardiopulmonary disease. Electronically Signed   By: Lupita Raider M.D.   On: 04/19/2019 17:28    Chart has been reviewed    Assessment/Plan   63 y.o. male with medical history significant of paroxysmal atrial fibrillation, SVT, essential hypertension, chest pain, and anxiety Admitted for a.fib w RVR  Present on Admission:   . Atrial fibrillation with RVR (HCC) -  - Admit to step down on Cardizem drip       CHA2D-VASC score 0        Will hold off on anticoagulant              Check TSH      Cycle cardiac enzymes      Obtain ECHO      Given recent travel and history will check a D-dimer      Cardiology consult in AM   . Leucocytosis-recent Campylobacter infection Currently no evidence of infection Diarrhea has improved  Other plan as per orders.  DVT prophylaxis:    Lovenox     Code Status:  FULL CODE  as per patient   I had personally discussed CODE STATUS with patient   Family Communication:   Family not at  Bedside    Disposition Plan:      To home once workup is complete and patient is stable                      Consults called: email cardiology    Admission status:  ED Disposition    None      Obs     Level of care       SDU tele indefinitely please discontinue once patient no longer qualifies  Precautions:   Airborne and Contact precautions  PPE: Used by the provider:   P100  eye Goggles,  Gloves       Lindon Kiel 04/19/2019, 9:57 PM    [facility]     after 2 AM please page floor coverage PA If 7AM-7PM, please contact the day team taking care of the patient using [email or web address]

## 2019-04-19 NOTE — ED Provider Notes (Signed)
[facility] DEPT Provider Note   CSN: [id number] Arrival date & time: 04/19/19  1632     History   Chief Complaint Chief Complaint  Patient presents with  . Palpitations    HPI Adrian Rose is a 63 y.o. male.     HPI   He presents for evaluation of palpitations, and is concerned about recurrence of SVT and atrial fibrillation.  Onset of symptoms today, around 3 PM.  He has a history of atrial fibrillation with low risk CHADS2 score, 0.  He is not anticoagulated.  He last saw his cardiologist about 14 months ago.  Subsequent to that visit he had a stress echo done which was negative.  This was done to evaluate chest pain.  He states he had onset of palpitations with a very rapid rate around 150, spontaneously.  He took 25 mg tablet of metoprolol with improvement of his sensation of tachycardia, but persistent feeling of irregular heartbeat.  He denies other illnesses including fever, chills, cough, shortness of breath, nausea, vomiting, focal weakness or paresthesia.  No other recent cardiac issues.  There are no other known modifying factors.  Past Medical History:  Diagnosis Date  . Anxiety   . Arthritis   . Atrial fibrillation ([place])   . Diverticulitis   . Hiatal hernia   . History of scarlet fever   . Hyperlipemia   . Hypertension   . Palpitations   . Stomach ulcer   . SVT (supraventricular tachycardia) ([facility])    hx of    Patient Active Problem List   Diagnosis Date Noted  . Atrial fibrillation ([place])   . SVT (supraventricular tachycardia) ([place])   . Hiatal hernia   . Arthritis   . Palpitations     Past Surgical History:  Procedure Laterality Date  . CHOLECYSTECTOMY  2015  . EAR CANALOPLASTY  2014  . KNEE SURGERY     arthroscopic left knee  . SHOULDER SURGERY     bilateral  . SPLENECTOMY          Home Medications    Prior to Admission medications   Medication Sig Start Date End Date Taking? Authorizing Provider   ALPRAZolam (XANAX) 0.25 MG tablet Take 0.125 mg by mouth at bedtime as needed.    Yes [provider]  aspirin 81 MG EC tablet Take 81 mg by mouth daily.   Yes [provider]  fluticasone (FLONASE) 50 MCG/ACT nasal spray Place 1 spray into the nose 2 (two) times daily.   Yes [provider]  Lactobacillus Rhamnosus, GG, (CULTURELLE) CAPS Take 1 capsule by mouth daily.   Yes [provider]  losartan (COZAAR) 50 MG tablet Take 50 mg by mouth daily.   Yes [provider]  metoprolol succinate (TOPROL-XL) 25 MG 24 hr tablet TAKE 1 TABLET BY MOUTH EVERY DAY Patient taking differently: Take 25 mg by mouth daily.  06/25/16  Yes Martinique, Peter M, MD  metoprolol tartrate (LOPRESSOR) 50 MG tablet Take 25 mg by mouth daily as needed.   Yes [provider]  Multiple Vitamin (MULTIVITAMIN) tablet Take 1 tablet by mouth daily.     Yes [provider]  Omeprazole 20 MG TBEC Take 20 mg by mouth daily. OTC daily    Yes [provider]  zolpidem (AMBIEN) 10 MG tablet Take 10 mg by mouth at bedtime as needed.     Yes [provider]    Family History Family History  Problem  Relation Age of Onset  . Hypertension Father   . Kidney cancer Father   . Lung cancer Father   . Prostate cancer Neg Hx     Social History Social History   Tobacco Use  . Smoking status: Never Smoker  . Smokeless tobacco: Never Used  Substance Use Topics  . Alcohol use: No  . Drug use: No     Allergies   Azithromycin   Review of Systems Review of Systems  All other systems reviewed and are negative.    Physical Exam Updated Vital Signs BP 124/85   Pulse (!) 119   Resp 13   SpO2 99%   Physical Exam Vitals signs and nursing note reviewed.  Constitutional:      General: He is not in acute distress.    Appearance: He is well-developed. He is not ill-appearing or toxic-appearing.  HENT:     Head: Normocephalic and atraumatic.      Right Ear: External ear normal.     Left Ear: External ear normal.     Mouth/Throat:     Mouth: Mucous membranes are moist.     Pharynx: No posterior oropharyngeal erythema.  Eyes:     Conjunctiva/sclera: Conjunctivae normal.     Pupils: Pupils are equal, round, and reactive to light.  Neck:     Musculoskeletal: Normal range of motion and neck supple.     Trachea: Phonation normal.  Cardiovascular:     Rate and Rhythm: Tachycardia present. Rhythm irregular.     Heart sounds: Normal heart sounds.  Pulmonary:     Effort: Pulmonary effort is normal.     Breath sounds: Normal breath sounds.  Abdominal:     Palpations: Abdomen is soft.     Tenderness: There is no abdominal tenderness.  Musculoskeletal: Normal range of motion.  Skin:    General: Skin is warm and dry.  Neurological:     Mental Status: He is alert and oriented to person, place, and time.     Cranial Nerves: No cranial nerve deficit.     Sensory: No sensory deficit.     Motor: No abnormal muscle tone.     Coordination: Coordination normal.  Psychiatric:        Mood and Affect: Mood normal.        Behavior: Behavior normal.        Thought Content: Thought content normal.        Judgment: Judgment normal.      ED Treatments / Results  Labs (all labs ordered are listed, but only abnormal results are displayed) Labs Reviewed  BASIC METABOLIC PANEL - Abnormal; Notable for the following components:      Result Value   Glucose, Bld 125 (*)    All other components within normal limits  CBC - Abnormal; Notable for the following components:   WBC 12.7 (*)    Platelets 502 (*)    All other components within normal limits  URINALYSIS, ROUTINE W REFLEX MICROSCOPIC - Abnormal; Notable for the following components:   Color, Urine COLORLESS (*)    Specific Gravity, Urine 1.003 (*)    Glucose, UA 50 (*)    Hgb urine dipstick SMALL (*)    All other components within normal limits  SARS CORONAVIRUS 2 (TAT 6-24 HRS)     EKG EKG Interpretation  Date/Time:  Monday April 19 2019 16:37:44 EDT Ventricular Rate:  122 PR Interval:    QRS Duration: 104 QT Interval:  315 QTC Calculation: 423  R Axis:   84 Text Interpretation:  Atrial fibrillation Borderline right axis deviation Low voltage, precordial leads No old tracing to compare Confirmed by Adrian BaleWentz, Adrian Rose [id number]) on 04/19/2019 5:23:17 PM  CHA2DS2/VAS Stroke Risk Points  Current as of 2 weeks ago     1 >= 2 Points: High Risk  1 - 1.99 Points: Medium Risk  0 Points: Low Risk    This is the only CHA2DS2/VAS Stroke Risk Points available for the past  year.: Last Change: N/A     Details    This score determines the patient's risk of having a stroke if the  patient has atrial fibrillation.       Points Metrics  0 Has Congestive Heart Failure:  No    Current as of 2 weeks ago  0 Has Vascular Disease:  No    Current as of 2 weeks ago  1 Has Hypertension:  Yes     Current as of 2 weeks ago  0 Age:  [age]    Current as of 2 weeks ago  0 Has Diabetes:  No    Current as of 2 weeks ago  0 Had Stroke:  No  Had TIA:  No  Had thromboembolism:  No    Current as of 2 weeks ago  0 Male:  No    Current as of 2 weeks ago           Radiology Dg Chest 2 View  Result Date: 04/19/2019 CLINICAL DATA:  Palpitations. EXAM: CHEST - 2 VIEW COMPARISON:  February 05, 2018. FINDINGS: The heart size and mediastinal contours are within normal limits. Both lungs are clear. No pneumothorax or pleural effusion is noted. The visualized skeletal structures are unremarkable. IMPRESSION: No active cardiopulmonary disease. Electronically Signed   By: Adrian RaiderJames  Green Rose M.D.   On: 04/19/2019 17:28    Procedures .Critical Care Performed by: Adrian BaleWentz, Adrian Rieves, MD Authorized by: Adrian BaleWentz, Adrian Schiffman, MD   Critical care provider statement:    Critical care time (minutes):  35   Critical care start time:  04/19/2019 5:20 PM   Critical care end time:  04/19/2019 7:40 PM   Critical care  time was exclusive of:  Separately billable procedures and treating other patients   Critical care was necessary to treat or prevent imminent or life-threatening deterioration of the following conditions:  Cardiac failure   Critical care was time spent personally by me on the following activities:  Blood draw for specimens, development of treatment plan with patient or surrogate, discussions with consultants, evaluation of patient's response to treatment, examination of patient, obtaining history from patient or surrogate, ordering and performing treatments and interventions, ordering and review of laboratory studies, pulse oximetry, re-evaluation of patient's condition, review of old charts and ordering and review of radiographic studies   (including critical care time)  Medications Ordered in ED Medications  sodium chloride flush (NS) 0.9 % injection 3 mL (0 mLs Intravenous Hold 04/19/19 1852)  diltiazem (CARDIZEM) 1 mg/mL load via infusion 10 mg (has no administration in time range)    And  diltiazem (CARDIZEM) 125 mg in dextrose 5% 125 mL (1 mg/mL) infusion (5 mg/hr Intravenous New Bag/Given 04/19/19 2020)  sodium chloride 0.9 % bolus 1,000 mL (0 mLs Intravenous Stopped 04/19/19 2012)  metoprolol tartrate (LOPRESSOR) injection 5 mg (5 mg Intravenous Given 04/19/19 1856)     Initial Impression / Assessment and Plan / ED Course  I have reviewed the triage vital signs and the nursing notes.  Pertinent labs & imaging results that were available during my care of the patient were reviewed by me and considered in my medical decision making (see chart for details).  Clinical Course as of Apr 18 2058  Mon Apr 19, 2019  1750 Normal except glucose high  Basic metabolic panel(!) [EW]  1751 No CHF or infiltrate, images reviewed by me  DG Chest 2 View [EW]  1804 Patient is asymptomatic for COVID-19, but may need to be admitted so we will order, tier 1B testing.   [EW]  [id number] Patient remains  tachycardic, 118, 30 minutes after Lopressor dose.  He has completed 1 L IV fluids as well.  Patient understands he will require additional treatment and overnight hospitalization for stabilization.   [EW]  2055 Normal except WBC elevated  CBC(!) [EW]  2057 No CHF or tach, images reviewed by me  DG Chest 2 View [EW]    Clinical Course User Index [EW] Adrian Bale, MD        Patient Vitals for the past 24 hrs:  BP Pulse Resp SpO2  04/19/19 2015 124/85 (!) 119 13 99 %  04/19/19 2000 (!) 122/92 (!) 120 11 100 %  04/19/19 1945 (!) 132/99 (!) 122 16 97 %  04/19/19 1930 (!) 129/100 (!) 122 14 98 %  04/19/19 1917 (!) 127/92 (!) 128 14 94 %  04/19/19 1913 - - - 99 %  04/19/19 1900 131/77 (!) 133 15 97 %  04/19/19 1830 (!) 135/98 (!) 130 16 99 %  04/19/19 1815 (!) 139/92 (!) 122 15 98 %  04/19/19 1800 (!) 139/94 (!) 143 16 99 %  04/19/19 1745 (!) 141/90 (!) 46 15 99 %  04/19/19 1730 (!) 123/99 (!) 106 19 99 %    8:58 PM Reevaluation with update and discussion. After initial assessment and treatment, an updated evaluation reveals.  Comfortable has no other acute complaints.  Findings discussed and questions answered. Adrian Rose   Medical Decision Making: Recurrent atrial fibrillation, now with RVR, resistant to initial therapy.  Patient require additional therapy with Cardizem load and drip, for persistent tachycardia.  Doubt ACS, PE or pneumonia.  JAYSIAH MARCHETTA was evaluated in Emergency Department on 04/19/2019 for the symptoms described in the history of present illness. He was evaluated in the context of the global COVID-19 pandemic, which necessitated consideration that the patient might be at risk for infection with the SARS-CoV-2 virus that causes COVID-19. Institutional protocols and algorithms that pertain to the evaluation of patients at risk for COVID-19 are in a state of rapid change based on information released by regulatory bodies including the CDC and federal and  state organizations. These policies and algorithms were followed during the patient's care in the ED.   CRITICAL CARE-yes Performed by: Adrian Rose  Nursing Notes Reviewed/ Care Coordinated Applicable Imaging Reviewed Interpretation of Laboratory Data incorporated into ED treatment  8:58 PM-Consult complete with hospitalist. Patient case explained and discussed.  She agrees to admit patient for further evaluation and treatment. Call ended at 9:33 PM  Plan: Admit   Final Clinical Impressions(s) / ED Diagnoses   Final diagnoses:  Atrial fibrillation with RVR [facility])    ED Discharge Orders    None       Adrian Bale, MD 04/19/19 2134

## 2019-04-20 ENCOUNTER — Observation Stay (HOSPITAL_BASED_OUTPATIENT_CLINIC_OR_DEPARTMENT_OTHER): Payer: No Typology Code available for payment source

## 2019-04-20 ENCOUNTER — Other Ambulatory Visit: Payer: Self-pay

## 2019-04-20 DIAGNOSIS — R002 Palpitations: Secondary | ICD-10-CM | POA: Diagnosis not present

## 2019-04-20 DIAGNOSIS — I10 Essential (primary) hypertension: Secondary | ICD-10-CM | POA: Diagnosis not present

## 2019-04-20 DIAGNOSIS — I4891 Unspecified atrial fibrillation: Secondary | ICD-10-CM | POA: Diagnosis not present

## 2019-04-20 DIAGNOSIS — I480 Paroxysmal atrial fibrillation: Secondary | ICD-10-CM | POA: Diagnosis not present

## 2019-04-20 LAB — CBC
HCT: 41.4 % (ref 39.0–52.0)
Hemoglobin: 13.4 g/dL (ref 13.0–17.0)
MCH: 31.1 pg (ref 26.0–34.0)
MCHC: 32.4 g/dL (ref 30.0–36.0)
MCV: 96.1 fL (ref 80.0–100.0)
Platelets: 492 10*3/uL — ABNORMAL HIGH (ref 150–400)
RBC: 4.31 MIL/uL (ref 4.22–5.81)
RDW: 13.9 % (ref 11.5–15.5)
WBC: 15.3 10*3/uL — ABNORMAL HIGH (ref 4.0–10.5)
nRBC: 0 % (ref 0.0–0.2)

## 2019-04-20 LAB — TSH: TSH: 0.495 u[IU]/mL (ref 0.350–4.500)

## 2019-04-20 LAB — COMPREHENSIVE METABOLIC PANEL
ALT: 15 U/L (ref 0–44)
AST: 13 U/L — ABNORMAL LOW (ref 15–41)
Albumin: 3.6 g/dL (ref 3.5–5.0)
Alkaline Phosphatase: 53 U/L (ref 38–126)
Anion gap: 9 (ref 5–15)
BUN: 8 mg/dL (ref 6–20)
CO2: 25 mmol/L (ref 22–32)
Calcium: 8.6 mg/dL — ABNORMAL LOW (ref 8.9–10.3)
Chloride: 110 mmol/L (ref 98–111)
Creatinine, Ser: 0.85 mg/dL (ref 0.61–1.24)
GFR calc Af Amer: >60 mL/min (ref >60)
GFR calc non Af Amer: >60 mL/min (ref >60)
Glucose, Bld: 104 mg/dL — ABNORMAL HIGH (ref 70–99)
Potassium: 3.7 mmol/L (ref 3.5–5.1)
Sodium: 144 mmol/L (ref 135–145)
Total Bilirubin: 0.7 mg/dL (ref 0.3–1.2)
Total Protein: 6.5 g/dL (ref 6.5–8.1)

## 2019-04-20 LAB — PHOSPHORUS: Phosphorus: 3 mg/dL (ref 2.5–4.6)

## 2019-04-20 LAB — ECHOCARDIOGRAM COMPLETE
Height: 67 in
Weight: 2584 oz

## 2019-04-20 LAB — MAGNESIUM: Magnesium: 2.2 mg/dL (ref 1.7–2.4)

## 2019-04-20 LAB — SARS CORONAVIRUS 2 (TAT 6-24 HRS): SARS Coronavirus 2: NEGATIVE

## 2019-04-20 LAB — HIV ANTIBODY (ROUTINE TESTING W REFLEX): HIV Screen 4th Generation wRfx: NONREACTIVE

## 2019-04-20 LAB — TROPONIN I (HIGH SENSITIVITY): Troponin I (High Sensitivity): 37 ng/L — ABNORMAL HIGH (ref <18)

## 2019-04-20 MED ORDER — ONDANSETRON HCL 4 MG/2ML IJ SOLN: 4.0000 mg | Freq: Four times a day (QID) | INTRAMUSCULAR | Status: DC | PRN

## 2019-04-20 MED ORDER — FLUTICASONE PROPIONATE 50 MCG/ACT NA SUSP
2.0000 | Freq: Every day | NASAL | Status: DC
  Administered 2019-04-20: 2 via NASAL
  Filled 2019-04-20: qty 16

## 2019-04-20 MED ORDER — PANTOPRAZOLE SODIUM 40 MG PO TBEC
40.0000 mg | DELAYED_RELEASE_TABLET | Freq: Every day | ORAL | Status: DC
  Administered 2019-04-20: 40 mg via ORAL
  Filled 2019-04-20: qty 1

## 2019-04-20 MED ORDER — DILTIAZEM HCL-DEXTROSE 125-5 MG/125ML-% IV SOLN (PREMIX)
5.0000 mg/h | INTRAVENOUS | Status: DC
  Administered 2019-04-20: 7.5 mg/h via INTRAVENOUS

## 2019-04-20 MED ORDER — APIXABAN 5 MG PO TABS
5.0000 mg | ORAL_TABLET | Freq: Two times a day (BID) | ORAL | Status: DC
  Administered 2019-04-20: 10:00:00 5 mg via ORAL
  Filled 2019-04-20: qty 1

## 2019-04-20 MED ORDER — METOPROLOL SUCCINATE ER 25 MG PO TB24
25.0000 mg | ORAL_TABLET | Freq: Every day | ORAL | Status: DC
  Administered 2019-04-20: 25 mg via ORAL
  Filled 2019-04-20: qty 1

## 2019-04-20 MED ORDER — ADULT MULTIVITAMIN W/MINERALS CH
1.0000 | ORAL_TABLET | Freq: Every day | ORAL | Status: DC
  Administered 2019-04-20: 1 via ORAL
  Filled 2019-04-20: qty 1

## 2019-04-20 MED ORDER — HYDROCODONE-ACETAMINOPHEN 5-325 MG PO TABS: 1.0000 | ORAL_TABLET | ORAL | Status: DC | PRN

## 2019-04-20 MED ORDER — ACETAMINOPHEN 650 MG RE SUPP: 650.0000 mg | Freq: Four times a day (QID) | RECTAL | Status: DC | PRN

## 2019-04-20 MED ORDER — ASPIRIN EC 81 MG PO TBEC: 81.0000 mg | DELAYED_RELEASE_TABLET | Freq: Every day | ORAL | Status: DC

## 2019-04-20 MED ORDER — ENOXAPARIN SODIUM 40 MG/0.4ML ~~LOC~~ SOLN: 40.0000 mg | SUBCUTANEOUS | Status: DC

## 2019-04-20 MED ORDER — RISAQUAD PO CAPS
1.0000 | ORAL_CAPSULE | Freq: Every day | ORAL | Status: DC
  Administered 2019-04-20: 1 via ORAL
  Filled 2019-04-20: qty 1

## 2019-04-20 MED ORDER — ACETAMINOPHEN 325 MG PO TABS: 650.0000 mg | ORAL_TABLET | Freq: Four times a day (QID) | ORAL | Status: DC | PRN

## 2019-04-20 MED ORDER — ALPRAZOLAM 0.25 MG PO TABS: 0.1250 mg | ORAL_TABLET | Freq: Every evening | ORAL | Status: DC | PRN

## 2019-04-20 MED ORDER — SODIUM CHLORIDE 0.9 % IV SOLN
INTRAVENOUS | Status: AC
  Administered 2019-04-20: 03:00:00 via INTRAVENOUS

## 2019-04-20 MED ORDER — ONDANSETRON HCL 4 MG PO TABS: 4.0000 mg | ORAL_TABLET | Freq: Four times a day (QID) | ORAL | Status: DC | PRN

## 2019-04-20 MED ORDER — ALPRAZOLAM 0.25 MG PO TABS
0.1250 mg | ORAL_TABLET | Freq: Two times a day (BID) | ORAL | Status: DC | PRN
  Administered 2019-04-20: 0.125 mg via ORAL
  Filled 2019-04-20: qty 1

## 2019-04-20 MED ORDER — ZOLPIDEM TARTRATE 10 MG PO TABS
10.0000 mg | ORAL_TABLET | Freq: Every evening | ORAL | Status: DC | PRN
  Filled 2019-04-20: qty 1

## 2019-04-20 MED FILL — Dextrose Inj 5%: INTRAVENOUS | Qty: 100 | Status: AC

## 2019-04-20 MED FILL — Diltiazem HCl IV Soln 125 MG/25ML (5 MG/ML): INTRAVENOUS | Qty: 125 | Status: AC

## 2019-04-20 NOTE — Consult Note (Addendum)
Cardiology Consultation:   Patient ID: Adrian Rose MRN: [id number]; DOB: 1956/04/29  Admit date: 04/19/2019 Date of Consult: 04/20/2019  Primary Care Provider: Daisy Floro, MD Primary Cardiologist: Previously seen by Dr. Swaziland but now follows at [facility]. Primary Electrophysiologist:  None    Patient Profile:   Adrian Rose is a 63 y.o. male with a history of paroxsymal atrial fibrillation on Aspirin, paroxsymal SVT, hypertension, hyperlipidemia, and anxiety who is being seen today for the evaluation of atrial fibrillation with RVR at the request of Dr. Adela Glimpse.  History of Present Illness:   Adrian Rose is a 63 year old male with the above history who was previously seen by Dr. Swaziland for paroxsymal SVT and paroxysmal atrial fibrillation. However, he now follows at [facility]. He was initially diagnosed with paroxysmal atrial fibrillation in 2016 but converted to normal sinus rhythm with IV Cardizem. CHA2DS2-VASc was 0-1 at that time so he was not started on anticoagulation. He has been maintained on Toprol with PRN Lopressor for palpitations or heart rate > 100 bpm. He was last seen by Elissa Hefty, NP at [facility], in 01/2018 at which time he reported a sharp chest pain on 2 occurs since his last visit but denied any other cardiac symptoms. He also expresssed concern about a EKG at his PCP's office that reportedly showed poor R wave progression. He was in normal sinus rhythm at that visit. Stress Echo was ordered for further evaluation of chest pain and came back normal.  Patient presented to the [facility] ED yesterday for further evaluation of palpitations. 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  He has a history of SVT and atrial fibrillation which has been well controlled on Toprol Lopressor stated above.  He occasionally has a few seconds of palpitations resolves with deep breathing.  However, he reports sudden onset of  palpitations yesterday afternoon at 3 PM with rates that he estimates were in the 160s.  He took a dose as needed Lopressor which helped improve his rate but he states his heart rate still felt very irregular and he knew he was in atrial fibrillation.  He noticed some lightheadedness when his rates were very high but denies any other cardiac symptoms including chest pain, shortness of breath, diaphoresis, nausea, vomiting, dizziness, syncope. No orthopnea, PND, or weight gain. He denies any recurrent chest pain since he last saw [facility] cardiology in 01/2018.  He is not as active as he would like to be due to arthritic pain in his knees but denies any chest pain with his level of activity.  He has had 2 recent GI illnesses with diarrhea within the last month. Stool sample came back positive for Campylobacter infection and he was treated with a course of Azithromycin and clear liquid diet. He reports 8-9 lb weight loss with this. Diarrhea has since resolved and he has been back on his normal diet since last Wednesday.  Otherwise, no recent fevers or illnesses.  No abnormal bleeding including hematochezia, melena, hematuria.  Given duration palpitations, patient decided to present to the ED for further evaluation.  Upon arrival to the ED, patient tachycardic and mildly hypertensive but vitals stable. EKG showed atrial fibrillation with rate of 122 bpm. High-sensitivity troponin minimally elevated at 21 >>37. Chest x-ray showed no acute findings. D-Dimer negative. WBC 12.7, Hgb 14.4, Plts 502. Na 141, K 3.5, Glucose 125, SCr 0.88. COVID-19 negative. He was started on IV Diltiazem and  also received dose of IV Lopressor in the ED with no significant improvement. Therefore, he was admitted for further evaluation.  At the time of this evaluation, patient is still in atrial fibrillation but is now rate controlled on IV diltiazem.  He denies any symptoms at this time.  Patient denies any history of tobacco use,  alcohol use, or recreational drug use.  He also stays away from caffeine.  He does have a family history of heart disease with his mother having history of SVT and his father having history of atrial fibrillation and possibly CAD.  Heart Pathway Score:     Past Medical History:  Diagnosis Date   Anxiety    Arthritis    Atrial fibrillation (HCC)    Diverticulitis    Hiatal hernia    History of scarlet fever    Hyperlipemia    Hypertension    Palpitations    Stomach ulcer    SVT (supraventricular tachycardia) (HCC)    hx of    Past Surgical History:  Procedure Laterality Date   CHOLECYSTECTOMY  2015   EAR CANALOPLASTY  2014   KNEE SURGERY     arthroscopic left knee   SHOULDER SURGERY     bilateral   SPLENECTOMY       Home Medications:  Prior to Admission medications   Medication Sig Start Date End Date Taking? Authorizing Provider  ALPRAZolam (XANAX) 0.25 MG tablet Take 0.125 mg by mouth at bedtime as needed.    Yes [provider]  aspirin 81 MG EC tablet Take 81 mg by mouth daily.   Yes [provider]  fluticasone (FLONASE) 50 MCG/ACT nasal spray Place 1 spray into the nose 2 (two) times daily.   Yes [provider]  Lactobacillus Rhamnosus, GG, (CULTURELLE) CAPS Take 1 capsule by mouth daily.   Yes [provider]  losartan (COZAAR) 50 MG tablet Take 50 mg by mouth daily.   Yes [provider]  metoprolol succinate (TOPROL-XL) 25 MG 24 hr tablet TAKE 1 TABLET BY MOUTH EVERY DAY Patient taking differently: Take 25 mg by mouth daily.  06/25/16  Yes Swaziland, Peter M, MD  metoprolol tartrate (LOPRESSOR) 50 MG tablet Take 25 mg by mouth daily as needed.   Yes [provider]  Multiple Vitamin (MULTIVITAMIN) tablet Take 1 tablet by mouth daily.     Yes [provider]  Omeprazole 20 MG TBEC Take 20 mg by mouth daily. OTC daily    Yes [provider]  zolpidem (AMBIEN) 10 MG tablet Take 10 mg  by mouth at bedtime as needed.     Yes [provider]    Inpatient Medications: Scheduled Meds:  acidophilus  1 capsule Oral Daily   aspirin EC  81 mg Oral Daily   diltiazem  10 mg Intravenous Once   enoxaparin (LOVENOX) injection  40 mg Subcutaneous Q24H   multivitamin with minerals  1 tablet Oral Daily   pantoprazole  40 mg Oral Daily   sodium chloride flush  3 mL Intravenous Once   Continuous Infusions:  sodium chloride 75 mL/hr at 04/20/19 0236   diltiazem (CARDIZEM) infusion 7.5 mg/hr (04/20/19 0219)   PRN Meds: acetaminophen **OR** acetaminophen, ALPRAZolam, HYDROcodone-acetaminophen, ondansetron **OR** ondansetron (ZOFRAN) IV, zolpidem  Allergies:    Allergies  Allergen Reactions   Azithromycin Diarrhea    Social History:   Social History   Socioeconomic History   Marital status: Married    Spouse name: Not on file  Number of children: 3   Years of education: Not on file   Highest education level: Not on file  Occupational History   Occupation: Environmental health practitioner comp  Social Needs   Financial resource strain: Not on file   Food insecurity    Worry: Not on file    Inability: Not on file   Transportation needs    Medical: Not on file    Non-medical: Not on file  Tobacco Use   Smoking status: Never Smoker   Smokeless tobacco: Never Used  Substance and Sexual Activity   Alcohol use: No   Drug use: No   Sexual activity: Not on file  Lifestyle   Physical activity    Days per week: Not on file    Minutes per session: Not on file   Stress: Not on file  Relationships   Social connections    Talks on phone: Not on file    Gets together: Not on file    Attends religious service: Not on file    Active member of club or organization: Not on file    Attends meetings of clubs or organizations: Not on file    Relationship status: Not on file   Intimate partner violence    Fear of current or ex partner: Not on file     Emotionally abused: Not on file    Physically abused: Not on file    Forced sexual activity: Not on file  Other Topics Concern   Not on file  Social History Narrative   Not on file    Family History:    Family History  Problem Relation Age of Onset   Hypertension Father    Kidney cancer Father    Lung cancer Father    Prostate cancer Neg Hx      ROS:  Please see the history of present illness.  Review of Systems  Constitutional: Negative for chills and fever.  HENT: Negative for congestion.   Respiratory: Negative for cough, hemoptysis and shortness of breath.   Cardiovascular: Positive for palpitations. Negative for chest pain, orthopnea, leg swelling and PND.  Gastrointestinal: Positive for diarrhea (recently but has now resolved). Negative for abdominal pain, blood in stool, melena, nausea and vomiting.  Genitourinary: Negative for hematuria.  Musculoskeletal: Positive for joint pain. Negative for myalgias.  Neurological: Negative for loss of consciousness. Dizziness: lightheadedness.  Endo/Heme/Allergies: Does not bruise/bleed easily.  Psychiatric/Behavioral: Negative for substance abuse. The patient is nervous/anxious.    Physical Exam/Data:   Vitals:   04/20/19 0115 04/20/19 0130 04/20/19 0209 04/20/19 0514  BP: 109/71 111/82 (!) 112/92 92/64  Pulse: (!) 41 60 69 63  Resp: 12 13 16 17   Temp:   98.8 F (37.1 C) 98.4 F (36.9 C)  TempSrc:   Oral Oral  SpO2: 96% 97% 99% 96%  Weight:   73.3 kg   Height:   5\' 7"  (1.702 m)     Intake/Output Summary (Last 24 hours) at 04/20/2019 0717 Last data filed at 04/20/2019 0300 Gross per 24 hour  Intake 2372.5 ml  Output 480 ml  Net 1892.5 ml   Last 3 Weights 04/20/2019 12/24/2016 12/05/2014  Weight (lbs) 161 lb 8 oz 163 lb 164 lb 11.2 oz  Weight (kg) 73.256 kg 73.936 kg 74.707 kg     Body mass index is 25.29 kg/m.  General: 63 y.o. male resting comfortably in no acute distress. HEENT: Normocephalic and  atraumatic. Sclera clear. EOMs intact. Neck: Supple. No carotid bruits. No  JVD. Heart: Irregularly irregular rhythm with normal rate. Distinct S1 and S2. No murmurs, gallops, or rubs. Radial and distal pedal pulses 2+ and equal bilaterally. Lungs: No increased work of breathing. Clear to ausculation bilaterally. No wheezes, rhonchi, or rales.  Abdomen: Soft, non-distended, and non-tender to palpation. Bowel sounds present in all 4 quadrants.  MSK: Normal strength and tone for age. Extremities: No lower extremity edema.    Skin: Warm and dry. Neuro: Alert and oriented x3. No focal deficits. Psych: Normal affect. Responds appropriately.  EKG:  The following EKGs were personally reviewed: - EKG from 04/19/2019 demonstrates atrial fibrillation with ventricular rate of 122 bpm with non-specific ST/T changes and borderline right axis deviation. - EKG from 04/20/2019 demonstrates atrial fibrillation with ventricular rate of 77 bpm with non-specific ST/T changes.  Telemetry:  Telemetry was personally reviewed and demonstrates:  Atrial fibrillation with baseline rates in the 60's to 90's with episodes of RVR (as high as the 140's) with activity such as ambulating to the bathroom.  Relevant CV Studies:  Echo 11/17/2014 [facility]): Summary: 1. Normal left ventricular size and systolic function with an estimated EF of 55-60%. 2. Unable to assess diastolic function with borderline left ventricular wall thickness and normal left atrial size. 3. Normal right sided chambers.Unable to estimate pulmonary pressures. 4. No significant valvular disease appreciated. _______________  Stress Echo 02/16/2018 [facility] Summary: - Normal stress echo. - Normal rest and stress wall motion increased ejection fraction with stress. - Negative by EKG.  Laboratory Data:  High Sensitivity Troponin:   Recent Labs  Lab 04/19/19 2211 04/20/19 0023  TROPONINIHS 21* 37*     Chemistry Recent Labs  Lab  04/19/19 1707 04/20/19 0442  NA 141 144  K 3.5 3.7  CL 106 110  CO2 25 25  GLUCOSE 125* 104*  BUN 10 8  CREATININE 0.88 0.85  CALCIUM 9.1 8.6*  GFRNONAA >60 >60  GFRAA >60 >60  ANIONGAP 10 9    Recent Labs  Lab 04/20/19 0442  PROT 6.5  ALBUMIN 3.6  AST 13*  ALT 15  ALKPHOS 53  BILITOT 0.7   Hematology Recent Labs  Lab 04/19/19 1707 04/20/19 0442  WBC 12.7* 15.3*  RBC 4.66 4.31  HGB 14.4 13.4  HCT 44.5 41.4  MCV 95.5 96.1  MCH 30.9 31.1  MCHC 32.4 32.4  RDW 13.8 13.9  PLT 502* 492*   BNPNo results for input(s): BNP, PROBNP in the last 168 hours.  DDimer  Recent Labs  Lab 04/19/19 2211  DDIMER 0.39     Radiology/Studies:  Dg Chest 2 View  Result Date: 04/19/2019 CLINICAL DATA:  Palpitations. EXAM: CHEST - 2 VIEW COMPARISON:  February 05, 2018. FINDINGS: The heart size and mediastinal contours are within normal limits. Both lungs are clear. No pneumothorax or pleural effusion is noted. The visualized skeletal structures are unremarkable. IMPRESSION: No active cardiopulmonary disease. Electronically Signed   By: Marijo Conception M.D.   On: 04/19/2019 17:28    Assessment and Plan:   Paroxysmal Atrial Fibrillation - Patient presented with palpitations and was found to be in atrial fibrillation with RVR. - Currently still in atrial fibrillation but now rate controlled.  He does have short episodes of RVR with rates as high as 140s with minimal activity such as ambulating to the restroom.  Asymptomatic with this. - Potassium 3.7. Magnesium 2.2. - TSH normal.  - D-dimer normal. - Echo pending - Continue IV Cardizem.  BP soft but stable. -  CHA2DS2-VASc = 1 (HTN).  Patient not on anticoagulation at home but is on aspirin 81 mg daily.  Will discontinue Aspirin and go ahead and start Eliquis 5 mg twice daily in case DCCV is needed this admission. -Trigger unclear.  Patient has recently had a GI illness has been doing well over the last week. WBC is mildly elevated  but has no clear signs of infection at this time.  Specific gravity on urinalysis is actually very dilute and renal function which argues against dehydration.  If patient does not convert on the IV Cardizem, may need TEE/DCCV this admission as patient is very aware that he is in atrial fibrillation.  Elevated Troponin - High-sensitivity troponin minimally elevated and relatively flat at 21 >> 37. - EKG shows non-specific ST/T changes. - [facility] - Patient denies any recent angina. - Suspect demand ischemia due to atrial fibrillation with RVR. Echo pending. Pending results, may benefit from additional ischemic evaluation but this can may be able to be done as outpatient.  History of SVT - Patient has history of SVT which has been well controlled on Toprol-XL 25mg  daily. - No evidence of SVT on telemetry. - Continue IV Cardizem for now. - Continue to hold home Toprol given soft Bps.  Hypertension - Patient mildly hypertensive on arrival but BP now soft with IV Cardizem. Most recent BP 92/64. - Continue IV Cardizem for rate control. - Continue to hold home Toprol and Losartan.  Leukocytosis - WBC elevated at 12.7 >> 15.3. - COVID-19 negative. - No signs of infection. - Possible due to recent GI illness. - Management per primary team.  For questions or updates, please contact [facility] Please consult [email or web address] for contact info under     Signed, Corrin ParkerCallie E Khristin Keleher, PA-C  04/20/2019 7:17 AM

## 2019-04-20 NOTE — Discharge Summary (Signed)
Physician Discharge Summary  Adrian Rose PPI:[id number] DOB: 01-09-1956 DOA: 04/19/2019  PCP: Daisy Floro, MD  Admit date: 04/19/2019 Discharge date: 04/20/2019  Admitted From: Home. Disposition: Home  Recommendations for Outpatient Follow-up:  1. Follow up with PCP in 1-2 weeks 2. Follow-up with cardiology as scheduled.  Home Health: Not applicable Equipment/Devices: Not applicable  Discharge Condition: Stable CODE STATUS: Full code Diet recommendation: Low-salt diet  Discharge summary: 63 year old gentleman with history of paroxysmal A. fib presented to the emergency room with acute onset of palpitation and nervousness, found to be A. fib with RVR.  He was admitted to hospital overnight on Cardizem infusion as he did not respond to injectable metoprolol in the ER.  Remained on Cardizem infusion and ultimately converted to normal sinus rhythm.  Seen and followed by cardiology.  Plan: Asymptomatic now.  TSH normal.  CHA2DS2-VASc score 1.  On baby aspirin. Cardiology recommended continue metoprolol XL 25 mg daily and breakthrough short acting metoprolol. Outpatient follow-up for consideration of ablation.  Patient has leukocytosis with WBC of 15,000.  He has no evidence of active infection.  Was recently treated with azithromycin for Campylobacter diarrhea.  Unknown significance.  Since he does not have any evidence of bacterial infection, there is no need to start him on antibiotics. He will need a repeat CBC checked next few days to ensure stabilization.  This was communicated with the patient and his wife and they understand.  Discharge Diagnoses:  Active Problems:   Palpitations   Atrial fibrillation with RVR (HCC)   Leucocytosis   Essential hypertension    Discharge Instructions  Discharge Instructions    Diet - low sodium heart healthy   Complete by: As directed    Increase activity slowly   Complete by: As directed      Allergies as of 04/20/2019       Reactions   Azithromycin Diarrhea      Medication List    TAKE these medications   ALPRAZolam 0.25 MG tablet Commonly known as: XANAX Take 0.125 mg by mouth at bedtime as needed. Notes to patient: 04/20/2019 bedtime    aspirin 81 MG EC tablet Take 81 mg by mouth daily. Notes to patient: 04/21/2019    Culturelle Caps Take 1 capsule by mouth daily. Notes to patient: 04/21/2019    fluticasone 50 MCG/ACT nasal spray Commonly known as: FLONASE Place 1 spray into the nose 2 (two) times daily. Notes to patient: 04/20/2019 evening    losartan 50 MG tablet Commonly known as: COZAAR Take 50 mg by mouth daily. Notes to patient: 04/21/2019    metoprolol succinate 25 MG 24 hr tablet Commonly known as: TOPROL-XL TAKE 1 TABLET BY MOUTH EVERY DAY   metoprolol tartrate 50 MG tablet Commonly known as: LOPRESSOR Take 25 mg by mouth daily as needed. Notes to patient: 04/21/2019    multivitamin tablet Take 1 tablet by mouth daily. Notes to patient: 04/21/2019    Omeprazole 20 MG Tbec Take 20 mg by mouth daily. OTC daily Notes to patient: 04/21/2019    zolpidem 10 MG tablet Commonly known as: AMBIEN Take 10 mg by mouth at bedtime as needed. Notes to patient: 04/20/2019 bedtime        Allergies  Allergen Reactions  . Azithromycin Diarrhea    Consultations:  Cardiology   Procedures/Studies: Dg Chest 2 View  Result Date: 04/19/2019 CLINICAL DATA:  Palpitations. EXAM: CHEST - 2 VIEW COMPARISON:  February 05, 2018. FINDINGS: The heart size and mediastinal contours  are within normal limits. Both lungs are clear. No pneumothorax or pleural effusion is noted. The visualized skeletal structures are unremarkable. IMPRESSION: No active cardiopulmonary disease. Electronically Signed   By: Marijo Conception M.D.   On: 04/19/2019 17:28     Subjective: Patient seen and examined.  In the morning, he was still having tachycardia and palpitation.  Later in the afternoon he converted  to normal sinus rhythm and is asymptomatic.  Discussed with cardiology and they recommended outpatient follow-up plans.   Discharge Exam: Vitals:   04/20/19 0514 04/20/19 1251  BP: 92/64 103/70  Pulse: 63 71  Resp: 17 16  Temp: 98.4 F (36.9 C) 98.2 F (36.8 C)  SpO2: 96% 96%   Vitals:   04/20/19 0130 04/20/19 0209 04/20/19 0514 04/20/19 1251  BP: 111/82 (!) 112/92 92/64 103/70  Pulse: 60 69 63 71  Resp: 13 16 17 16   Temp:  98.8 F (37.1 C) 98.4 F (36.9 C) 98.2 F (36.8 C)  TempSrc:  Oral Oral Oral  SpO2: 97% 99% 96% 96%  Weight:  73.3 kg    Height:  5\' 7"  (1.702 m)      General: Pt is alert, awake, not in acute distress, on room air. Cardiovascular: RRR, S1/S2 +, no rubs, no gallops Respiratory: CTA bilaterally, no wheezing, no rhonchi Abdominal: Soft, NT, ND, bowel sounds + Extremities: no edema, no cyanosis    The results of significant diagnostics from this hospitalization (including imaging, microbiology, ancillary and laboratory) are listed below for reference.     Microbiology: Recent Results (from the past 240 hour(s))  SARS CORONAVIRUS 2 (TAT 6-24 HRS) Nasopharyngeal Nasopharyngeal Swab     Status: None   Collection Time: 04/19/19  6:33 PM   Specimen: Nasopharyngeal Swab  Result Value Ref Range Status   SARS Coronavirus 2 NEGATIVE NEGATIVE Final    Comment: (NOTE) SARS-CoV-2 target nucleic acids are NOT DETECTED. The SARS-CoV-2 RNA is generally detectable in upper and lower respiratory specimens during the acute phase of infection. Negative results do not preclude SARS-CoV-2 infection, do not rule out co-infections with other pathogens, and should not be used as the sole basis for treatment or other patient management decisions. Negative results must be combined with clinical observations, patient history, and epidemiological information. The expected result is Negative. Fact Sheet for Patients: SugarRoll.be Fact Sheet  for Healthcare Providers: https://www.woods-mathews.com/ This test is not yet approved or cleared by the [place] FDA and  has been authorized for detection and/or diagnosis of SARS-CoV-2 by FDA under an Emergency Use Authorization (EUA). This EUA will remain  in effect (meaning this test can be used) for the duration of the COVID-19 declaration under Section 56 4(b)(1) of the Act, 21 U.S.C. section 360bbb-3(b)(1), unless the authorization is terminated or revoked sooner. Performed at [facility], [place] [address] [place], [place] [address]      Labs: BNP (last 3 results) No results for input(s): BNP in the last 8760 hours. Basic Metabolic Panel: Recent Labs  Lab 04/19/19 1707 04/20/19 0442  NA 141 144  K 3.5 3.7  CL 106 110  CO2 25 25  GLUCOSE 125* 104*  BUN 10 8  CREATININE 0.88 0.85  CALCIUM 9.1 8.6*  MG  --  2.2  PHOS  --  3.0   Liver Function Tests: Recent Labs  Lab 04/20/19 0442  AST 13*  ALT 15  ALKPHOS 53  BILITOT 0.7  PROT 6.5  ALBUMIN 3.6   No results  for input(s): LIPASE, AMYLASE in the last 168 hours. No results for input(s): AMMONIA in the last 168 hours. CBC: Recent Labs  Lab 04/19/19 1707 04/20/19 0442  WBC 12.7* 15.3*  HGB 14.4 13.4  HCT 44.5 41.4  MCV 95.5 96.1  PLT 502* 492*   Cardiac Enzymes: No results for input(s): CKTOTAL, CKMB, CKMBINDEX, TROPONINI in the last 168 hours. BNP: Invalid input(s): POCBNP CBG: No results for input(s): GLUCAP in the last 168 hours. D-Dimer Recent Labs    04/19/19 2211  DDIMER 0.39   Hgb A1c No results for input(s): HGBA1C in the last 72 hours. Lipid Profile No results for input(s): CHOL, HDL, LDLCALC, TRIG, CHOLHDL, LDLDIRECT in the last 72 hours. Thyroid function studies Recent Labs    04/20/19 0442  TSH 0.495   Anemia work up No results for input(s): VITAMINB12, FOLATE, FERRITIN, TIBC, IRON, RETICCTPCT in the last 72 hours. Urinalysis    Component Value  Date/Time   COLORURINE COLORLESS (A) 04/19/2019 1847   APPEARANCEUR CLEAR 04/19/2019 1847   APPEARANCEUR Clear 12/24/2016 1010   LABSPEC 1.003 (L) 04/19/2019 1847   PHURINE 8.0 04/19/2019 1847   GLUCOSEU 50 (A) 04/19/2019 1847   HGBUR SMALL (A) 04/19/2019 1847   BILIRUBINUR NEGATIVE 04/19/2019 1847   BILIRUBINUR Negative 12/24/2016 1010   KETONESUR NEGATIVE 04/19/2019 1847   PROTEINUR NEGATIVE 04/19/2019 1847   NITRITE NEGATIVE 04/19/2019 1847   LEUKOCYTESUR NEGATIVE 04/19/2019 1847   Sepsis Labs Invalid input(s): PROCALCITONIN,  WBC,  LACTICIDVEN Microbiology Recent Results (from the past 240 hour(s))  SARS CORONAVIRUS 2 (TAT 6-24 HRS) Nasopharyngeal Nasopharyngeal Swab     Status: None   Collection Time: 04/19/19  6:33 PM   Specimen: Nasopharyngeal Swab  Result Value Ref Range Status   SARS Coronavirus 2 NEGATIVE NEGATIVE Final    Comment: (NOTE) SARS-CoV-2 target nucleic acids are NOT DETECTED. The SARS-CoV-2 RNA is generally detectable in upper and lower respiratory specimens during the acute phase of infection. Negative results do not preclude SARS-CoV-2 infection, do not rule out co-infections with other pathogens, and should not be used as the sole basis for treatment or other patient management decisions. Negative results must be combined with clinical observations, patient history, and epidemiological information. The expected result is Negative. Fact Sheet for Patients: HairSlick.nohttps://www.fda.gov/media/138098/download Fact Sheet for Healthcare Providers: quierodirigir.comhttps://www.fda.gov/media/138095/download This test is not yet approved or cleared by the [place] FDA and  has been authorized for detection and/or diagnosis of SARS-CoV-2 by FDA under an Emergency Use Authorization (EUA). This EUA will remain  in effect (meaning this test can be used) for the duration of the COVID-19 declaration under Section 56 4(b)(1) of the Act, 21 U.S.C. section 360bbb-3(b)(1), unless the  authorization is terminated or revoked sooner. Performed at [facility], [address] [place], [place] [address]      Time coordinating discharge: 35 minutes  SIGNED:   Dorcas CarrowKuber Ernesto Zukowski, MD  [facility] 04/20/2019, 2:40 PM

## 2019-04-20 NOTE — ED Notes (Signed)
Attempted to call report [phone]. No answer.

## 2019-04-20 NOTE — Progress Notes (Signed)
Pt arrived to 1438 from ER. Oriented to room and equip. PAS completed. No needs voiced at this time.

## 2019-04-20 NOTE — Progress Notes (Signed)
PROGRESS NOTE    Adrian Rose  RSW:[id number] DOB: 1955/09/12 DOA: 04/19/2019 PCP: Lawerance Cruel, MD    Brief Narrative:  63 year old, history of paroxysmal A. fib on aspirin, SVT, hypertension hyperlipidemia and anxiety presented to emergency room with sudden onset of palpitations.  In the emergency room he was noted to be A. fib with RVR with heart rate more than 160.  Admitted due to persistent symptoms despite treatment in the ER.  Assessment & Plan:   Active Problems:   Palpitations   Atrial fibrillation with RVR (HCC)   Leucocytosis  Paroxysmal A. fib: Remains with persistent A. fib overnight.  Currently on Cardizem infusion.  Continue.  Followed by cardiology.  Started on Eliquis for anticoagulation.  TSH was normal.  Recent gastroenteritis with Campylobacter might have aggravated his condition.  Leukocytosis: Stress related.  No evidence of bacterial infection on clinical examination.  We will continue to monitor.  Anxiety: Uses as needed Ativan that he will continue.  Patient has persistent symptomatic A. fib, he is continually needing IV infusion of rate control medications and monitoring.  He may potentially need cardioversion if no improvement.  Will need ongoing hospitalization and inpatient management.   DVT prophylaxis: Eliquis Code Status: Full code Family Communication: None Disposition Plan: Pending improvement   Consultants:   Cardiology  Procedures:   None  Antimicrobials:   None   Subjective: Patient seen and examined.  Patient stated he feels okay at rest, he went to bathroom and felt palpitation.  Heart rate remains more than 100, fluctuates.  Currently on 7.5 mics of Cardizem.  Objective: Vitals:   04/20/19 0115 04/20/19 0130 04/20/19 0209 04/20/19 0514  BP: 109/71 111/82 (!) 112/92 92/64  Pulse: (!) 41 60 69 63  Resp: 12 13 16 17   Temp:   98.8 F (37.1 C) 98.4 F (36.9 C)  TempSrc:   Oral Oral  SpO2: 96% 97% 99% 96%   Weight:   73.3 kg   Height:   5\' 7"  (1.702 m)     Intake/Output Summary (Last 24 hours) at 04/20/2019 1141 Last data filed at 04/20/2019 0300 Gross per 24 hour  Intake 2372.5 ml  Output 480 ml  Net 1892.5 ml   Filed Weights   04/20/19 0209  Weight: 73.3 kg    Examination:  General exam: Appears calm and comfortable, slightly anxious Respiratory system: Clear to auscultation. Respiratory effort normal. Cardiovascular system: S1 & S2 heard, irregularly irregular.  no JVD, murmurs, rubs, gallops or clicks. No pedal edema. Gastrointestinal system: Abdomen is nondistended, soft and nontender. No organomegaly or masses felt. Normal bowel sounds heard. Central nervous system: Alert and oriented. No focal neurological deficits. Extremities: Symmetric 5 x 5 power. Skin: No rashes, lesions or ulcers Psychiatry: Judgement and insight appear normal. Mood & affect appropriate.     Data Reviewed: I have personally reviewed following labs and imaging studies  CBC: Recent Labs  Lab 04/19/19 1707 04/20/19 0442  WBC 12.7* 15.3*  HGB 14.4 13.4  HCT 44.5 41.4  MCV 95.5 96.1  PLT 502* 093*   Basic Metabolic Panel: Recent Labs  Lab 04/19/19 1707 04/20/19 0442  NA 141 144  K 3.5 3.7  CL 106 110  CO2 25 25  GLUCOSE 125* 104*  BUN 10 8  CREATININE 0.88 0.85  CALCIUM 9.1 8.6*  MG  --  2.2  PHOS  --  3.0   GFR: Estimated Creatinine Clearance: 86.4 mL/min (by C-G formula based on SCr of 0.85  mg/dL). Liver Function Tests: Recent Labs  Lab 04/20/19 0442  AST 13*  ALT 15  ALKPHOS 53  BILITOT 0.7  PROT 6.5  ALBUMIN 3.6   No results for input(s): LIPASE, AMYLASE in the last 168 hours. No results for input(s): AMMONIA in the last 168 hours. Coagulation Profile: No results for input(s): INR, PROTIME in the last 168 hours. Cardiac Enzymes: No results for input(s): CKTOTAL, CKMB, CKMBINDEX, TROPONINI in the last 168 hours. BNP (last 3 results) No results for input(s): PROBNP  in the last 8760 hours. HbA1C: No results for input(s): HGBA1C in the last 72 hours. CBG: No results for input(s): GLUCAP in the last 168 hours. Lipid Profile: No results for input(s): CHOL, HDL, LDLCALC, TRIG, CHOLHDL, LDLDIRECT in the last 72 hours. Thyroid Function Tests: Recent Labs    04/20/19 0442  TSH 0.495   Anemia Panel: No results for input(s): VITAMINB12, FOLATE, FERRITIN, TIBC, IRON, RETICCTPCT in the last 72 hours. Sepsis Labs: No results for input(s): PROCALCITON, LATICACIDVEN in the last 168 hours.  Recent Results (from the past 240 hour(s))  SARS CORONAVIRUS 2 (TAT 6-24 HRS) Nasopharyngeal Nasopharyngeal Swab     Status: None   Collection Time: 04/19/19  6:33 PM   Specimen: Nasopharyngeal Swab  Result Value Ref Range Status   SARS Coronavirus 2 NEGATIVE NEGATIVE Final    Comment: (NOTE) SARS-CoV-2 target nucleic acids are NOT DETECTED. The SARS-CoV-2 RNA is generally detectable in upper and lower respiratory specimens during the acute phase of infection. Negative results do not preclude SARS-CoV-2 infection, do not rule out co-infections with other pathogens, and should not be used as the sole basis for treatment or other patient management decisions. Negative results must be combined with clinical observations, patient history, and epidemiological information. The expected result is Negative. Fact Sheet for Patients: HairSlick.no Fact Sheet for Healthcare Providers: quierodirigir.com This test is not yet approved or cleared by the [place] FDA and  has been authorized for detection and/or diagnosis of SARS-CoV-2 by FDA under an Emergency Use Authorization (EUA). This EUA will remain  in effect (meaning this test can be used) for the duration of the COVID-19 declaration under Section 56 4(b)(1) of the Act, 21 U.S.C. section 360bbb-3(b)(1), unless the authorization is terminated or revoked sooner.  Performed at [facility], [address], [place], [place] [address]          Radiology Studies: Dg Chest 2 View  Result Date: 04/19/2019 CLINICAL DATA:  Palpitations. EXAM: CHEST - 2 VIEW COMPARISON:  February 05, 2018. FINDINGS: The heart size and mediastinal contours are within normal limits. Both lungs are clear. No pneumothorax or pleural effusion is noted. The visualized skeletal structures are unremarkable. IMPRESSION: No active cardiopulmonary disease. Electronically Signed   By: Lupita Raider M.D.   On: 04/19/2019 17:28        Scheduled Meds: . acidophilus  1 capsule Oral Daily  . apixaban  5 mg Oral BID  . diltiazem  10 mg Intravenous Once  . fluticasone  2 spray Each Nare Daily  . multivitamin with minerals  1 tablet Oral Daily  . pantoprazole  40 mg Oral Daily  . sodium chloride flush  3 mL Intravenous Once   Continuous Infusions: . sodium chloride 75 mL/hr at 04/20/19 0236  . diltiazem (CARDIZEM) infusion 7.5 mg/hr (04/20/19 0219)     LOS: 0 days    Time spent: 25 minutes    Dorcas Carrow, MD [facility] Pager [phone]

## 2019-04-20 NOTE — ED Notes (Signed)
Prepared to transport pt to floor. Spouse said she had to get pt's overnight bag from the car. Waiting for her to return.

## 2019-04-20 NOTE — ED Notes (Signed)
ED TO INPATIENT HANDOFF REPORT  ED Nurse Name and Phone #: Laisa Larrick [phone]  S Name/Age/Gender Adrian Rose 63 y.o. male Room/Bed: WA25/WA25  Code Status   Code Status: Not on file  Home/SNF/Other Home Patient oriented to: self, place, time and situation Is this baseline? Yes   Triage Complete: Triage complete  Chief Complaint Atrial Afib since 2pm  Triage Note Patient here from home with complaints of palpitations. Hx of SVT and AFib. Reports symptoms started at 3pm. No blood thinners. Metoprolol.    Allergies Allergies  Allergen Reactions  . Azithromycin Diarrhea    Level of Care/Admitting Diagnosis ED Disposition    ED Disposition Condition Comment   Admit  Hospital Area: [facility] [[id number]]  Level of Care: Telemetry [5]  Admit to tele based on following criteria: Other see comments  Comments: a.fib w rvr  Covid Evaluation: Asymptomatic Screening Protocol (No Symptoms)  Diagnosis: Atrial fibrillation with RVR [facility]) [[id number]]  Admitting Physician: Therisa Doyne [[id number]]  Attending Physician: Therisa Doyne [[id number]]  PT Class (Do Not Modify): Observation [104]  PT Acc Code (Do Not Modify): Observation [10022]       B Medical/Surgery History Past Medical History:  Diagnosis Date  . Anxiety   . Arthritis   . Atrial fibrillation (HCC)   . Diverticulitis   . Hiatal hernia   . History of scarlet fever   . Hyperlipemia   . Hypertension   . Palpitations   . Stomach ulcer   . SVT (supraventricular tachycardia) (HCC)    hx of   Past Surgical History:  Procedure Laterality Date  . CHOLECYSTECTOMY  2015  . EAR CANALOPLASTY  2014  . KNEE SURGERY     arthroscopic left knee  . SHOULDER SURGERY     bilateral  . SPLENECTOMY       A IV Location/Drains/Wounds Patient Lines/Drains/Airways Status   Active Line/Drains/Airways    Name:   Placement date:   Placement time:   Site:   Days:   Peripheral IV 04/19/19 Right Forearm    04/19/19    1800    Forearm   1          Intake/Output Last 24 hours  Intake/Output Summary (Last 24 hours) at 04/20/2019 0118 Last data filed at 04/19/2019 2122 Gross per 24 hour  Intake 2190 ml  Output -  Net 2190 ml    Labs/Imaging Results for orders placed or performed during the hospital encounter of 04/19/19 (from the past 48 hour(s))  Basic metabolic panel     Status: Abnormal   Collection Time: 04/19/19  5:07 PM  Result Value Ref Range   Sodium 141 135 - 145 mmol/L   Potassium 3.5 3.5 - 5.1 mmol/L   Chloride 106 98 - 111 mmol/L   CO2 25 22 - 32 mmol/L   Glucose, Bld 125 (H) 70 - 99 mg/dL   BUN 10 6 - 20 mg/dL   Creatinine, Ser 3.78 0.61 - 1.24 mg/dL   Calcium 9.1 8.9 - 58.8 mg/dL   GFR calc non Af Amer >60 >60 mL/min   GFR calc Af Amer >60 >60 mL/min   Anion gap 10 5 - 15    Comment: Performed at [facility], [address], [place], [place] [address]  CBC     Status: Abnormal   Collection Time: 04/19/19  5:07 PM  Result Value Ref Range   WBC 12.7 (H) 4.0 - 10.5 K/uL   RBC 4.66 4.22 -  5.81 MIL/uL   Hemoglobin 14.4 13.0 - 17.0 g/dL   HCT 47.844.5 29.539.0 - 62.152.0 %   MCV 95.5 80.0 - 100.0 fL   MCH 30.9 26.0 - 34.0 pg   MCHC 32.4 30.0 - 36.0 g/dL   RDW 30.813.8 65.711.5 - 84.615.5 %   Platelets 502 (H) 150 - 400 K/uL   nRBC 0.0 0.0 - 0.2 %    Comment: Performed at [facility], [address] [place], [place] [address]  Urinalysis, Routine w reflex microscopic     Status: Abnormal   Collection Time: 04/19/19  6:47 PM  Result Value Ref Range   Color, Urine COLORLESS (A) YELLOW   APPearance CLEAR CLEAR   Specific Gravity, Urine 1.003 (L) 1.005 - 1.030   pH 8.0 5.0 - 8.0   Glucose, UA 50 (A) NEGATIVE mg/dL   Hgb urine dipstick SMALL (A) NEGATIVE   Bilirubin Urine NEGATIVE NEGATIVE   Ketones, ur NEGATIVE NEGATIVE mg/dL   Protein, ur NEGATIVE NEGATIVE mg/dL   Nitrite NEGATIVE NEGATIVE   Leukocytes,Ua NEGATIVE NEGATIVE   RBC / HPF 0-5 0  - 5 RBC/hpf   Bacteria, UA NONE SEEN NONE SEEN    Comment: Performed at [facility], [address] [place], [place] [address]  Troponin I (High Sensitivity)     Status: Abnormal   Collection Time: 04/19/19 10:11 PM  Result Value Ref Range   Troponin I (High Sensitivity) 21 (H) <18 ng/L    Comment: (NOTE) Elevated high sensitivity troponin I (hsTnI) values and significant  changes across serial measurements may suggest ACS but many other  chronic and acute conditions are known to elevate hsTnI results.  Refer to the "Links" section for chest pain algorithms and additional  guidance. Performed at [facility], [address] [place], [place] [address]   D-dimer, quantitative (not at [facility])     Status: None   Collection Time: 04/19/19 10:11 PM  Result Value Ref Range   D-Dimer, Quant 0.39 0.00 - 0.50 ug/mL-FEU    Comment: (NOTE) At the manufacturer cut-off of 0.50 ug/mL FEU, this assay has been documented to exclude PE with a sensitivity and negative predictive value of 97 to 99%.  At this time, this assay has not been approved by the FDA to exclude DVT/VTE. Results should be correlated with clinical presentation. Performed at [facility], [address] [place], [place] [address]    Dg Chest 2 View  Result Date: 04/19/2019 CLINICAL DATA:  Palpitations. EXAM: CHEST - 2 VIEW COMPARISON:  February 05, 2018. FINDINGS: The heart size and mediastinal contours are within normal limits. Both lungs are clear. No pneumothorax or pleural effusion is noted. The visualized skeletal structures are unremarkable. IMPRESSION: No active cardiopulmonary disease. Electronically Signed   By: Lupita RaiderJames  Green Jr M.D.   On: 04/19/2019 17:28    Pending Labs Unresulted Labs (From admission, onward)    Start     Ordered   04/19/19 1804  SARS CORONAVIRUS 2 (TAT 6-24 HRS) Nasopharyngeal Nasopharyngeal Swab  (Symptomatic/High Risk of Exposure/Tier 1  Patients Labs with Precautions)  Once,   STAT    Question Answer Comment  Is this test for diagnosis or screening Diagnosis of ill patient   Symptomatic for COVID-19 as defined by CDC No   Hospitalized for COVID-19 No   Admitted to ICU for COVID-19 No   Previously tested for COVID-19 No   Resident in a congregate (group) care setting No  Employed in healthcare setting No      04/19/19 1804   Signed and Held  HIV Antibody (routine testing w rflx)  (HIV Antibody (Routine testing w reflex) panel)  Tomorrow morning,   R     Signed and Held   Signed and Held  Magnesium  Tomorrow morning,   R    Comments: Call MD if <1.5    Signed and Held   Signed and Held  Phosphorus  Tomorrow morning,   R     Signed and Held   Signed and Held  TSH  Once,   R    Comments: Cancel if already done within 1 month and notify MD    Signed and Held   Signed and Held  Comprehensive metabolic panel  Once,   R    Comments: Cal MD for K<3.5 or >5.0    Signed and Held   Signed and Held  CBC  Once,   R    Comments: Call for hg <8.0    Signed and Held          Vitals/Pain Today's Vitals   04/19/19 2330 04/19/19 2345 04/20/19 0000 04/20/19 0015  BP: 113/90 117/85 111/80 118/87  Pulse: 88 82 82 78  Resp: 15 13 13 11   Temp:      TempSrc:      SpO2: 97% 96% 98% 97%  PainSc:        Isolation Precautions No active isolations  Medications Medications  sodium chloride flush (NS) 0.9 % injection 3 mL (0 mLs Intravenous Hold 04/19/19 1852)  diltiazem (CARDIZEM) 1 mg/mL load via infusion 10 mg (has no administration in time range)    And  diltiazem (CARDIZEM) 125 mg in dextrose 5% 125 mL (1 mg/mL) infusion (7.5 mg/hr Intravenous Rate/Dose Change 04/19/19 2124)  sodium chloride 0.9 % bolus 1,000 mL (0 mLs Intravenous Stopped 04/19/19 2012)  metoprolol tartrate (LOPRESSOR) injection 5 mg (5 mg Intravenous Given 04/19/19 1856)    Mobility walks Low fall risk   Focused Assessments Cardiac Assessment  Handoff:  Cardiac Rhythm: Sinus tachycardia No results found for: CKTOTAL, CKMB, CKMBINDEX, TROPONINI Lab Results  Component Value Date   DDIMER 0.39 04/19/2019   Does the Patient currently have chest pain? No     R Recommendations: See Admitting Provider Note  Report given to:   Additional Notes: NA

## 2019-04-20 NOTE — Progress Notes (Signed)
Echocardiogram 2D Echocardiogram has been performed.  Oneal Deputy Brittain Smithey 04/20/2019, 9:06 AM

## 2019-04-20 NOTE — Discharge Instructions (Signed)

## 2019-04-24 NOTE — Plan of Care (Signed)
Reviewed discharge instructions with patient and wife, questions answered, verbalized understanding.  Patient transported via wheelchair to main entrance to be taken home by wife.

## 2019-05-05 ENCOUNTER — Encounter: Payer: Self-pay | Admitting: Cardiology

## 2019-05-05 ENCOUNTER — Other Ambulatory Visit: Payer: Self-pay

## 2019-05-05 ENCOUNTER — Other Ambulatory Visit (INDEPENDENT_AMBULATORY_CARE_PROVIDER_SITE_OTHER): Payer: PRIVATE HEALTH INSURANCE

## 2019-05-05 ENCOUNTER — Ambulatory Visit (INDEPENDENT_AMBULATORY_CARE_PROVIDER_SITE_OTHER): Payer: PRIVATE HEALTH INSURANCE | Admitting: Cardiology

## 2019-05-05 VITALS — BP 126/76 | HR 78 | Ht 67.0 in | Wt 164.4 lb

## 2019-05-05 DIAGNOSIS — I10 Essential (primary) hypertension: Secondary | ICD-10-CM

## 2019-05-05 DIAGNOSIS — I480 Paroxysmal atrial fibrillation: Secondary | ICD-10-CM | POA: Diagnosis not present

## 2019-05-05 DIAGNOSIS — I493 Ventricular premature depolarization: Secondary | ICD-10-CM | POA: Diagnosis not present

## 2019-05-05 NOTE — Patient Instructions (Signed)
Medication Instructions:  Your physician recommends that you continue on your current medications as directed. Please refer to the Current Medication list given to you today.  *If you need a refill on your cardiac medications before your next appointment, please call your pharmacy*  Lab Work: NOne If you have labs (blood work) drawn today and your tests are completely normal, you will receive your results only by: Marland Kitchen MyChart Message (if you have MyChart) OR . A paper copy in the mail If you have any lab test that is abnormal or we need to change your treatment, we will call you to review the results.  Testing/Procedures: A zio monitor was placed today. It will remain on for 14 days. You will then return monitor and event diary in provided box. It takes 1-2 weeks for report to be downloaded and returned to [place]. We will call you with the results. If monitor falls off or has orange flashing light, please call Zio for further instructions.     Follow-Up: At [facility], you and your health needs are our priority.  As part of our continuing mission to provide you with exceptional heart care, we have created designated Provider Care Teams.  These Care Teams include your primary Cardiologist (physician) and Advanced Practice Providers (APPs -  Physician Assistants and Nurse Practitioners) who all work together to provide you with the care you need, when you need it.  Your next appointment:   1 month  The format for your next appointment:   In Person  Provider:   Berniece Salines, DO  Other Instructions

## 2019-05-05 NOTE — Progress Notes (Signed)
Cardiology Office Note:    Date:  05/05/2019   ID:  Adrian Rose, DOB 05-Jan-1956, MRN [id number]  PCP:  Lawerance Cruel, MD  Cardiologist:  Berniece Salines, DO  Electrophysiologist:  None   Referring MD: Lawerance Cruel, [facility] follow up.  History of Present Illness:    Adrian Rose is a 63 y.o. male with a hx of hypertension, hyperlipidemia, paroxysmal atrial fibrillation, PVC.  The patient is for follow-up visit after his hospitalization at [facility].  He tells me that 1 08/01/2017 he ended up going to the hospital after he experienced significant applications and noted that his heart heartbeat was irregular.  He states that this was familiar to him as his initial atrial fibrillation episode in May 2015 was similar.  He believes that at the time of his episode his heart rate may have been in 160s but by the time he got to the ED heart rate was closer to 150s.  He still converted spontaneously without need for cardioversion.  He has been in sinus rhythm since his hospital discharge.  He denies any chest pain, shortness of breath, nausea, vomiting.   Past Medical History:  Diagnosis Date   Anxiety    Arthritis    Atrial fibrillation (HCC)    Diverticulitis    Hiatal hernia    History of scarlet fever    Hyperlipemia    Hypertension    Palpitations    Stomach ulcer    SVT (supraventricular tachycardia) ([place])    hx of    Past Surgical History:  Procedure Laterality Date   CHOLECYSTECTOMY  2015   EAR CANALOPLASTY  2014   KNEE SURGERY     arthroscopic left knee   SHOULDER SURGERY     bilateral   SPLENECTOMY      Current Medications: Current Meds  Medication Sig   ALPRAZolam (XANAX) 0.25 MG tablet Take 0.125 mg by mouth at bedtime as needed.    aspirin 81 MG EC tablet Take 81 mg by mouth daily.   fluticasone (FLONASE) 50 MCG/ACT nasal spray Place 1 spray into the nose 2 (two) times daily.   Lactobacillus Rhamnosus, GG,  (CULTURELLE) CAPS Take 1 capsule by mouth daily.   losartan (COZAAR) 50 MG tablet Take 50 mg by mouth daily.   metoprolol succinate (TOPROL-XL) 25 MG 24 hr tablet TAKE 1 TABLET BY MOUTH EVERY DAY (Patient taking differently: Take 25 mg by mouth daily. )   metoprolol tartrate (LOPRESSOR) 50 MG tablet Take 25 mg by mouth daily as needed.   Multiple Vitamin (MULTIVITAMIN) tablet Take 1 tablet by mouth daily.     Omeprazole 20 MG TBEC Take 20 mg by mouth daily. OTC daily    zolpidem (AMBIEN) 10 MG tablet Take 10 mg by mouth at bedtime as needed.       Allergies:   Azithromycin   Social History   Socioeconomic History   Marital status: Married    Spouse name: Not on file   Number of children: 3   Years of education: Not on file   Highest education level: Not on file  Occupational History   Occupation: Primary school teacher comp  Social Needs   Financial resource strain: Not on file   Food insecurity    Worry: Not on file    Inability: Not on file   Transportation needs    Medical: Not on file    Non-medical: Not on file  Tobacco Use  Smoking status: Never Smoker   Smokeless tobacco: Never Used  Substance and Sexual Activity   Alcohol use: No   Drug use: No   Sexual activity: Not on file  Lifestyle   Physical activity    Days per week: Not on file    Minutes per session: Not on file   Stress: Not on file  Relationships   Social connections    Talks on phone: Not on file    Gets together: Not on file    Attends religious service: Not on file    Active member of club or organization: Not on file    Attends meetings of clubs or organizations: Not on file    Relationship status: Not on file  Other Topics Concern   Not on file  Social History Narrative   Not on file     Family History: The patient's family history includes Hypertension in his father; Kidney cancer in his father; Lung cancer in his father. There is no history of Prostate cancer.  ROS:    Review of Systems  Constitution: Negative for decreased appetite, fever and weight gain.  HENT: Negative for congestion, ear discharge, hoarse voice and sore throat.   Eyes: Negative for discharge, redness, vision loss in right eye and visual halos.  Cardiovascular: Negative for chest pain, dyspnea on exertion, leg swelling, orthopnea and palpitations.  Respiratory: Negative for cough, hemoptysis, shortness of breath and snoring.   Endocrine: Negative for heat intolerance and polyphagia.  Hematologic/Lymphatic: Negative for bleeding problem. Does not bruise/bleed easily.  Skin: Negative for flushing, nail changes, rash and suspicious lesions.  Musculoskeletal: Negative for arthritis, joint pain, muscle cramps, myalgias, neck pain and stiffness.  Gastrointestinal: Negative for abdominal pain, bowel incontinence, diarrhea and excessive appetite.  Genitourinary: Negative for decreased libido, genital sores and incomplete emptying.  Neurological: Negative for brief paralysis, focal weakness, headaches and loss of balance.  Psychiatric/Behavioral: Negative for altered mental status, depression and suicidal ideas.  Allergic/Immunologic: Negative for HIV exposure and persistent infections.    EKGs/Labs/Other Studies Reviewed:    The following studies were reviewed today:  EKG:  The ekg ordered today demonstrates sinus rhythm, 65 bpm, no significant change compared to EKG done on 04/20/2019.  Recent Labs: 04/20/2019: ALT 15; BUN 8; Creatinine, Ser 0.85; Hemoglobin 13.4; Magnesium 2.2; Platelets 492; Potassium 3.7; Sodium 144; TSH 0.495  Recent Lipid Panel No results found for: CHOL, TRIG, HDL, CHOLHDL, VLDL, LDLCALC, LDLDIRECT  Physical Exam:    VS:  BP 126/76 (BP Location: Left Arm)    Pulse 78    Ht 5\' 7"  (1.702 m)    Wt 164 lb 6.4 oz (74.6 kg)    BMI 25.75 kg/m     Wt Readings from Last 3 Encounters:  05/05/19 164 lb 6.4 oz (74.6 kg)  04/20/19 161 lb 8 oz (73.3 kg)  12/24/16 163 lb  (73.9 kg)     GEN: Well nourished, well developed in no acute distress HEENT: Normal NECK: No JVD; No carotid bruits LYMPHATICS: No lymphadenopathy CARDIAC: S1S2 noted,RRR, no murmurs, rubs, gallops RESPIRATORY:  Clear to auscultation without rales, wheezing or rhonchi  ABDOMEN: Soft, non-tender, non-distended, +bowel sounds, no guarding. EXTREMITIES: No edema, No cyanosis, no clubbing MUSCULOSKELETAL:  No edema; No deformity  SKIN: Warm and dry NEUROLOGIC:  Alert and oriented x 3, non-focal PSYCHIATRIC:  Normal affect, good insight  ASSESSMENT:    1. Paroxysmal atrial fibrillation (HCC)   2. Essential hypertension   3. PVC (premature ventricular contraction)  PLAN:    1.  He is in sinus rhythm today.  During his visit today the patient has some questions about pulmonary vein isolation and his questions were answered to his satisfaction.  He is still deciding if he wants to get this procedure done.  Today I like to place a Zio patch in a patient to be able to understand his A. fib burden, how often he is in and out of atrial fibrillation and if there is any other atrial arrhythmia.  In addition he tells me that he does have PVCs therefore with the monitoring place will also help us understand his PVC burden.  2.  He will be continued on his current antihypertensive medications.  As well as his aspirin 81 mg daily.  the patient is in agreement with the above plan. The patient left the office in stable condition.  The patient will follow up in 1 month.   Medication Adjustments/Labs and Tests Ordered: Current medicines are reviewed at length with the patient today.  Concerns regarding medicines are outlined above.  Orders Placed This Encounter  Procedures   LONG TERM MONITOR (3-14 DAYS)   EKG 12-Lead   No orders of the defined types were placed in this encounter.   Patient Instructions  Medication Instructions:  Your physician recommends that you continue on your current  medications as directed. Please refer to the Current Medication list given to you today.  *If you need a refill on your cardiac medications before your next appointment, please call your pharmacy*  Lab Work: NOne If you have labs (blood work) drawn today and your tests are completely normal, you will receive your results only by:  MyChart Message (if you have MyChart) OR  A paper copy in the mail If you have any lab test that is abnormal or we need to change your treatment, we will call you to review the results.  Testing/Procedures: A zio monitor was placed today. It will remain on for 14 days. You will then return monitor and event diary in provided box. It takes 1-2 weeks for report to be downloaded and returned to us. We will call you with the results. If monitor falls off or has orange flashing light, please call Zio for further instructions.     Follow-Up: At [facility], you and your health needs are our priority.  As part of our continuing mission to provide you with exceptional heart care, we have created designated Provider Care Teams.  These Care Teams include your primary Cardiologist (physician) and Advanced Practice Providers (APPs -  Physician Assistants and Nurse Practitioners) who all work together to provide you with the care you need, when you need it.  Your next appointment:   1 month  The format for your next appointment:   In Person  Provider:   Thomasene RippleKardie Solyana Nonaka, DO  Other Instructions      Adopting a Healthy Lifestyle.  Know what a healthy weight is for you (roughly BMI <25) and aim to maintain this   Aim for 7+ servings of fruits and vegetables daily   65-80+ fluid ounces of water or unsweet tea for healthy kidneys   Limit to max 1 drink of alcohol per day; avoid smoking/tobacco   Limit animal fats in diet for cholesterol and heart health - choose grass fed whenever available   Avoid highly processed foods, and foods high in saturated/trans fats     Aim for low stress - take time to unwind and care for  your mental health   Aim for 150 min of moderate intensity exercise weekly for heart health, and weights twice weekly for bone health   Aim for 7-9 hours of sleep daily   When it comes to diets, agreement about the perfect plan isnt easy to find, even among the experts. Experts at the [facility] [employer] developed an idea known as the Healthy Eating Plate. Just imagine a plate divided into logical, healthy portions.   The emphasis is on diet quality:   Load up on vegetables and fruits - one-half of your plate: Aim for color and variety, and remember that potatoes dont count.   Go for whole grains - one-quarter of your plate: Whole wheat, barley, wheat berries, quinoa, oats, brown rice, and foods made with them. If you want pasta, go with whole wheat pasta.   Protein power - one-quarter of your plate: Fish, chicken, beans, and nuts are all healthy, versatile protein sources. Limit red meat.   The diet, however, does go beyond the plate, offering a few other suggestions.   Use healthy plant oils, such as olive, canola, soy, corn, sunflower and peanut. Check the labels, and avoid partially hydrogenated oil, which have unhealthy trans fats.   If youre thirsty, drink water. Coffee and tea are good in moderation, but skip sugary drinks and limit milk and dairy products to one or two daily servings.   The type of carbohydrate in the diet is more important than the amount. Some sources of carbohydrates, such as vegetables, fruits, whole grains, and beans-are healthier than others.   Finally, stay active  Signed, Thomasene Ripple, DO  05/05/2019 4:57 PM    [facility]

## 2019-06-03 ENCOUNTER — Encounter: Payer: Self-pay | Admitting: Cardiology

## 2019-06-03 ENCOUNTER — Ambulatory Visit (INDEPENDENT_AMBULATORY_CARE_PROVIDER_SITE_OTHER): Payer: PRIVATE HEALTH INSURANCE | Admitting: Cardiology

## 2019-06-03 ENCOUNTER — Other Ambulatory Visit: Payer: Self-pay

## 2019-06-03 VITALS — BP 110/80 | HR 78 | Ht 67.0 in | Wt 170.0 lb

## 2019-06-03 DIAGNOSIS — I10 Essential (primary) hypertension: Secondary | ICD-10-CM | POA: Diagnosis not present

## 2019-06-03 DIAGNOSIS — I480 Paroxysmal atrial fibrillation: Secondary | ICD-10-CM | POA: Diagnosis not present

## 2019-06-03 DIAGNOSIS — I493 Ventricular premature depolarization: Secondary | ICD-10-CM | POA: Diagnosis not present

## 2019-06-03 MED ORDER — METOPROLOL SUCCINATE ER 25 MG PO TB24: ORAL_TABLET | ORAL | 1 refills | Status: DC

## 2019-06-03 MED ORDER — METOPROLOL TARTRATE 50 MG PO TABS: ORAL_TABLET | ORAL | 3 refills | Status: DC

## 2019-06-03 NOTE — Patient Instructions (Addendum)
Medication Instructions: Your physician has recommended you make the following change in your medication:   INCREASE:: Topol XL (metoprolol succinate) 25 mg in the AM and 12.5 mg in the PM  *If you need a refill on your cardiac medications before your next appointment, please call your pharmacy*  Lab Work: None If you have labs (blood work) drawn today and your tests are completely normal, you will receive your results only by: Marland Kitchen MyChart Message (if you have MyChart) OR . A paper copy in the mail If you have any lab test that is abnormal or we need to change your treatment, we will call you to review the results.  Testing/Procedures: None  Follow-Up: At [facility], you and your health needs are our priority.  As part of our continuing mission to provide you with exceptional heart care, we have created designated Provider Care Teams.  These Care Teams include your primary Cardiologist (physician) and Advanced Practice Providers (APPs -  Physician Assistants and Nurse Practitioners) who all work together to provide you with the care you need, when you need it.  Your next appointment:   6 month(s)  The format for your next appointment:   In Person  Provider:   Berniece Salines, DO  Other Instructions  You are being referred to Dr Caryl Comes an electrophysiologist. They wil contact you with an appointment date and time.

## 2019-06-03 NOTE — Progress Notes (Signed)
Cardiology Office Note:    Date:  06/03/2019   ID:  Adrian Rose, DOB 03/23/1956, MRN [id number]  PCP:  Daisy Florooss, Adrian Alan, MD  Cardiologist:  Thomasene RippleKardie Sharline Lehane, DO  Electrophysiologist:  None   Referring MD: Daisy Florooss, Adrian Alan, MD   Chief Complaint  Patient presents with  . Follow-up    History of Present Illness:    Adrian FastStephen C Mejorado is a 63 y.o. male with a hx of hypertension, hyperlipidemia, paroxysmal atrial fibrillation, PVC.  I saw the patient May 05, 2019 as a hospital follow-up.  At that time he had been admitted to the [facility] for atrial fibrillation.  He spontaneously converted to sinus rhythm without intervention during his hospitalization.  During our initial visit he was anticipating getting a A. fib ablation (pulmonary vein isolation) he had several questions about it those questions were answered at that time..  At the conclusion of our visit ZIO monitor was placed on the patient to understand his A. fib burden as well as his PVC burden.  He was able to wear his monitor in the interim he was able to wear his ZIO monitor.  He tells me he has been experiencing intermittent PVCs.  But does not think he has gone back into A. fib since his last visit.   Past Medical History:  Diagnosis Date  . Anxiety   . Arthritis   . Atrial fibrillation (HCC)   . Diverticulitis   . Hiatal hernia   . History of scarlet fever   . Hyperlipemia   . Hypertension   . Palpitations   . Stomach ulcer   . SVT (supraventricular tachycardia) (HCC)    hx of    Past Surgical History:  Procedure Laterality Date  . CHOLECYSTECTOMY  2015  . EAR CANALOPLASTY  2014  . KNEE SURGERY     arthroscopic left knee  . SHOULDER SURGERY     bilateral  . SPLENECTOMY      Current Medications: Current Meds  Medication Sig  . ALPRAZolam (XANAX) 0.25 MG tablet Take 0.125 mg by mouth at bedtime as needed.   Marland Kitchen. aspirin 81 MG EC tablet Take 81 mg by mouth daily.  . fluticasone  (FLONASE) 50 MCG/ACT nasal spray Place 1 spray into the nose 2 (two) times daily.  . Lactobacillus Rhamnosus, GG, (CULTURELLE) CAPS Take 1 capsule by mouth daily.  Marland Kitchen. losartan (COZAAR) 100 MG tablet Take 50 mg by mouth daily.  . Multiple Vitamin (MULTIVITAMIN) tablet Take 1 tablet by mouth daily.    Marland Kitchen. omeprazole (PRILOSEC) 20 MG capsule TAKE 1 CAPSULE BY MOUTH ONCE (1) DAILY  . zolpidem (AMBIEN) 10 MG tablet Take 10 mg by mouth at bedtime as needed.    . [DISCONTINUED] losartan (COZAAR) 50 MG tablet Take 50 mg by mouth daily.  . [DISCONTINUED] metoprolol succinate (TOPROL-XL) 25 MG 24 hr tablet TAKE 1 TABLET BY MOUTH EVERY DAY (Patient taking differently: Take 25 mg by mouth daily. )  . [DISCONTINUED] metoprolol tartrate (LOPRESSOR) 50 MG tablet Take 25 mg by mouth daily as needed.  . [DISCONTINUED] Omeprazole 20 MG TBEC Take 20 mg by mouth daily. OTC daily      Allergies:   Azithromycin   Social History   Socioeconomic History  . Marital status: Married    Spouse name: Not on file  . Number of children: 3  . Years of education: Not on file  . Highest education level: Not on file  Occupational History  . Occupation:  nurse workman's comp  Social Needs  . Financial resource strain: Not on file  . Food insecurity    Worry: Not on file    Inability: Not on file  . Transportation needs    Medical: Not on file    Non-medical: Not on file  Tobacco Use  . Smoking status: Never Smoker  . Smokeless tobacco: Never Used  Substance and Sexual Activity  . Alcohol use: No  . Drug use: No  . Sexual activity: Not on file  Lifestyle  . Physical activity    Days per week: Not on file    Minutes per session: Not on file  . Stress: Not on file  Relationships  . Social Musician on phone: Not on file    Gets together: Not on file    Attends religious service: Not on file    Active member of club or organization: Not on file    Attends meetings of clubs or organizations: Not on  file    Relationship status: Not on file  Other Topics Concern  . Not on file  Social History Narrative  . Not on file     Family History: The patient's family history includes Hypertension in his father; Kidney cancer in his father; Lung cancer in his father. There is no history of Prostate cancer.  ROS:   Review of Systems  Constitution: Negative for decreased appetite, fever and weight gain.  HENT: Negative for congestion, ear discharge, hoarse voice and sore throat.   Eyes: Negative for discharge, redness, vision loss in right eye and visual halos.  Cardiovascular: Reports heart palpitations.  Negative for chest pain, dyspnea on exertion, leg swelling, orthopnea. Respiratory: Negative for cough, hemoptysis, shortness of breath and snoring.   Endocrine: Negative for heat intolerance and polyphagia.  Hematologic/Lymphatic: Negative for bleeding problem. Does not bruise/bleed easily.  Skin: Negative for flushing, nail changes, rash and suspicious lesions.  Musculoskeletal: Negative for arthritis, joint pain, muscle cramps, myalgias, neck pain and stiffness.  Gastrointestinal: Negative for abdominal pain, bowel incontinence, diarrhea and excessive appetite.  Genitourinary: Negative for decreased libido, genital sores and incomplete emptying.  Neurological: Negative for brief paralysis, focal weakness, headaches and loss of balance.  Psychiatric/Behavioral: Negative for altered mental status, depression and suicidal ideas.  Allergic/Immunologic: Negative for HIV exposure and persistent infections.    EKGs/Labs/Other Studies Reviewed:    The following studies were reviewed today:   EKG: None today  ZIO monitor The patient wore the monitor for 14 days, starting 05/05/2019. Indication: Paroxysmal atrial fibrillation The minimum heart rate was 48 bpm, the maximum heart rate was 164 bpm, and average heart rate was of 72 bpm. Predominant underlying rhythm was Sinus Rhythm.  8  Supraventricular Tachycardia runs occurred, the run with the fastest interval lasting 4 beats with a maximum heart rate of 164 bpm, the longest lasting 6 beats with an avg rate of 117 bpm.  Supraventricular Tachycardia was detected within +/- 45 seconds of symptomatic patient event. Premature atrial complexes were rare (<1.0%). Premature ventricular complexes were rare (<1.0%). Ventricular Trigeminy was present. No ventricular tachycardia, No pauses, No AV block and no atrial fibrillation present. 12 patient triggered events were noted: 3 associated with supraventricular tachycardia, 6 associated with premature ventricular complexes.  Conclusion: The study is remarkable for symptomatic paroxysmal supraventricular is likely atrial tachycardia with variable block.  There is also symptomatic premature ventricular complexes.  Recent Labs: 04/20/2019: ALT 15; BUN 8; Creatinine, Ser 0.85; Hemoglobin 13.4;  Magnesium 2.2; Platelets 492; Potassium 3.7; Sodium 144; TSH 0.495  Recent Lipid Panel No results found for: CHOL, TRIG, HDL, CHOLHDL, VLDL, LDLCALC, LDLDIRECT  Physical Exam:    VS:  BP 110/80 (BP Location: Left Arm, Patient Position: Sitting, Cuff Size: Normal)   Pulse 78   Ht 5\' 7"  (1.702 m)   Wt 170 lb (77.1 kg)   SpO2 97%   BMI 26.63 kg/m     Wt Readings from Last 3 Encounters:  06/03/19 170 lb (77.1 kg)  05/05/19 164 lb 6.4 oz (74.6 kg)  04/20/19 161 lb 8 oz (73.3 kg)     GEN: Well nourished, well developed in no acute distress HEENT: Normal NECK: No JVD; No carotid bruits LYMPHATICS: No lymphadenopathy CARDIAC: S1S2 noted,RRR, no murmurs, rubs, gallops RESPIRATORY:  Clear to auscultation without rales, wheezing or rhonchi  ABDOMEN: Soft, non-tender, non-distended, +bowel sounds, no guarding. EXTREMITIES: No edema, No cyanosis, no clubbing MUSCULOSKELETAL:  No edema; No deformity  SKIN: Warm and dry NEUROLOGIC:  Alert and oriented x 3, non-focal PSYCHIATRIC:  Normal affect,  good insight  ASSESSMENT:    1. Paroxysmal atrial fibrillation (HCC)   2. Essential hypertension   3. Symptomatic PVCs    PLAN:    I discussed the result of his monitor. Based on the information gathered there was evidence of symptomatic pvc and symptomatic paroxysmal atrial tachycardia. For this he is agreeable to increase his current dose of beta blocker. His Toprol will now be 37.5mg  daily - he will take 25 mg in the morning and 12.5 mg at night.   Then much of the discussion was center around his atrial fibrillation management for rhythm control. He is symptomatic when even his is in atrial fibrillation. We discussed the possibility of starting flecainide as he does not have any structural heart disease. I explained that he can be maintained in sinus rhythm with this medication. He would prefer not to take any antiarrhythmics drugs due to concern for potential side effects. He is interested in Pulmonary vein isolation for his rhythm control. I do not think this is unreasonable.  He will remain on Aspirin 81 mg daily - he understands that he may need anticoagulation in the future.  I will refer him to EP service ( he prefers to see Dr. Caryl Comes).   Documentation of Prolonged Non Face-To-Face Time I personally reviewed outside records for today's encounter.  This included review of historical hospital records, office notes, cardiac studies (e.g. Echocardiograms, Zio Monitor  etc), radiologic studies (e.g. MRIs, CTs, Xrays, etc), laboratory data, etc.  The pertinent findings are outlined in my note.  The total non-face to face time spent for record review was 30 minutes.  The patient is in agreement with the above plan. The patient left the office in stable condition.  The patient will follow up in 6 months.   Medication Adjustments/Labs and Tests Ordered: Current medicines are reviewed at length with the patient today.  Concerns regarding medicines are outlined above.  Orders Placed This  Encounter  Procedures  . Ambulatory referral to Cardiac Electrophysiology   Meds ordered this encounter  Medications  . metoprolol succinate (TOPROL XL) 25 MG 24 hr tablet    Sig: Take 25 mg (1 tab) in the Am and 12.5 mg (1/2 tab) In the PM    Dispense:  135 tablet    Refill:  1  . metoprolol tartrate (LOPRESSOR) 50 MG tablet    Sig: Take 1/2 tab (25 mg) daily as  needed    Dispense:  180 tablet    Refill:  3    Patient Instructions  Medication Instructions: Your physician has recommended you make the following change in your medication:   INCREASE:: Topol XL (metoprolol succinate) 25 mg in the AM and 12.5 mg in the PM  *If you need a refill on your cardiac medications before your next appointment, please call your pharmacy*  Lab Work: None If you have labs (blood work) drawn today and your tests are completely normal, you will receive your results only by: Marland Kitchen MyChart Message (if you have MyChart) OR . A paper copy in the mail If you have any lab test that is abnormal or we need to change your treatment, we will call you to review the results.  Testing/Procedures: None  Follow-Up: At [facility], you and your health needs are our priority.  As part of our continuing mission to provide you with exceptional heart care, we have created designated Provider Care Teams.  These Care Teams include your primary Cardiologist (physician) and Advanced Practice Providers (APPs -  Physician Assistants and Nurse Practitioners) who all work together to provide you with the care you need, when you need it.  Your next appointment:   6 month(s)  The format for your next appointment:   In Person  Provider:   Thomasene Ripple, DO  Other Instructions  You are being referred to Dr Graciela Husbands an electrophysiologist. They wil contact you with an appointment date and time.     Adopting a Healthy Lifestyle.  Know what a healthy weight is for you (roughly BMI <25) and aim to maintain this   Aim for  7+ servings of fruits and vegetables daily   65-80+ fluid ounces of water or unsweet tea for healthy kidneys   Limit to max 1 drink of alcohol per day; avoid smoking/tobacco   Limit animal fats in diet for cholesterol and heart health - choose grass fed whenever available   Avoid highly processed foods, and foods high in saturated/trans fats   Aim for low stress - take time to unwind and care for your mental health   Aim for 150 min of moderate intensity exercise weekly for heart health, and weights twice weekly for bone health   Aim for 7-9 hours of sleep daily   When it comes to diets, agreement about the perfect plan isnt easy to find, even among the experts. Experts at the [employer] developed an idea known as the Healthy Eating Plate. Just imagine a plate divided into logical, healthy portions.   The emphasis is on diet quality:   Load up on vegetables and fruits - one-half of your plate: Aim for color and variety, and remember that potatoes dont count.   Go for whole grains - one-quarter of your plate: Whole wheat, barley, wheat berries, quinoa, oats, brown rice, and foods made with them. If you want pasta, go with whole wheat pasta.   Protein power - one-quarter of your plate: Fish, chicken, beans, and nuts are all healthy, versatile protein sources. Limit red meat.   The diet, however, does go beyond the plate, offering a few other suggestions.   Use healthy plant oils, such as olive, canola, soy, corn, sunflower and peanut. Check the labels, and avoid partially hydrogenated oil, which have unhealthy trans fats.   If youre thirsty, drink water. Coffee and tea are good in moderation, but skip sugary drinks and limit milk and dairy products to  one or two daily servings.   The type of carbohydrate in the diet is more important than the amount. Some sources of carbohydrates, such as vegetables, fruits, whole grains, and beans-are healthier than others.    Finally, stay active  Signed, Thomasene Ripple, DO  06/03/2019 10:08 PM    [facility]

## 2019-06-04 ENCOUNTER — Ambulatory Visit: Payer: PRIVATE HEALTH INSURANCE | Admitting: Cardiology

## 2019-07-20 ENCOUNTER — Ambulatory Visit (INDEPENDENT_AMBULATORY_CARE_PROVIDER_SITE_OTHER): Payer: PRIVATE HEALTH INSURANCE | Admitting: Internal Medicine

## 2019-07-20 ENCOUNTER — Encounter: Payer: Self-pay | Admitting: Internal Medicine

## 2019-07-20 ENCOUNTER — Other Ambulatory Visit: Payer: Self-pay

## 2019-07-20 VITALS — BP 128/78 | HR 69 | Ht 67.0 in | Wt 167.2 lb

## 2019-07-20 DIAGNOSIS — I480 Paroxysmal atrial fibrillation: Secondary | ICD-10-CM | POA: Diagnosis not present

## 2019-07-20 DIAGNOSIS — I471 Supraventricular tachycardia: Secondary | ICD-10-CM | POA: Diagnosis not present

## 2019-07-20 DIAGNOSIS — R002 Palpitations: Secondary | ICD-10-CM | POA: Diagnosis not present

## 2019-07-20 NOTE — Progress Notes (Signed)
ELECTROPHYSIOLOGY CONSULT NOTE  Patient ID: Adrian Rose, MRN: [id number], DOB/AGE: 1956-01-21 64 y.o. Admit date: (Not on file) Date of Consult: 07/20/2019  Primary Physician: Lawerance Cruel, MD Primary Cardiologist: KT     Adrian Rose is a 64 y.o. male who is being seen today for the evaluation of atrial fibrillation at the request of Dr. Harriet Masson.    HPI Adrian Rose is a 64 y.o. male seen for atrial fibrillation.  The first event occurred 2014 in the context of significant psychosocial stress and caffeine.  The event terminated spontaneously while getting Cardizem for rate control.  The next episode was 10/24 again presented with atrial fibrillation rapid rate.  His ECG was personally reviewed and confirmed.  The episode also terminated spontaneously.   Event Recorder personnally reviewed symptomatic PACs, PVCs and nonsustained atrial tachycardia  The patient denies chest pain, shortness of breath, nocturnal dyspnea, orthopnea or peripheral edema.  There have been no palpitations, lightheadedness or syncope.  Functional status is limited largely by his knees   DATE TEST EF   10/20 Echo   60-65% %         Date Cr K Hgb  10/20 0.85 3.7 13.4           Thromboembolic risk factors (, HTN-1) for a CHADSVASc Score of 1    Past Medical History:  Diagnosis Date  . Anxiety   . Arthritis   . Atrial fibrillation ([place])   . Diverticulitis   . Hiatal hernia   . History of scarlet fever   . Hyperlipemia   . Hypertension   . Palpitations   . Stomach ulcer   . SVT (supraventricular tachycardia) (HCC)    hx of      Surgical History:  Past Surgical History:  Procedure Laterality Date  . CHOLECYSTECTOMY  2015  . EAR CANALOPLASTY  2014  . KNEE SURGERY     arthroscopic left knee  . SHOULDER SURGERY     bilateral  . SPLENECTOMY       Home Meds: Current Meds  Medication Sig  . ALPRAZolam (XANAX) 0.25 MG tablet Take 0.125 mg by mouth at bedtime as  needed.   Marland Kitchen aspirin 81 MG EC tablet Take 81 mg by mouth daily.  . fluticasone (FLONASE) 50 MCG/ACT nasal spray Place 1 spray into the nose 2 (two) times daily.  . Lactobacillus Rhamnosus, GG, (CULTURELLE) CAPS Take 1 capsule by mouth daily.  Marland Kitchen losartan (COZAAR) 100 MG tablet Take 50 mg by mouth daily.  . metoprolol succinate (TOPROL XL) 25 MG 24 hr tablet Take 25 mg (1 tab) in the Am and 12.5 mg (1/2 tab) In the PM  . metoprolol tartrate (LOPRESSOR) 50 MG tablet Take 1/2 tab (25 mg) daily as needed  . Multiple Vitamin (MULTIVITAMIN) tablet Take 1 tablet by mouth daily.    Marland Kitchen omeprazole (PRILOSEC) 20 MG capsule TAKE 1 CAPSULE BY MOUTH ONCE (1) DAILY  . zolpidem (AMBIEN) 10 MG tablet Take 10 mg by mouth at bedtime as needed.      Allergies:  Allergies  Allergen Reactions  . Azithromycin Diarrhea    Social History   Socioeconomic History  . Marital status: Married    Spouse name: Not on file  . Number of children: 3  . Years of education: Not on file  . Highest education level: Not on file  Occupational History  . Occupation: Primary school teacher comp  Tobacco Use  . Smoking status:  Never Smoker  . Smokeless tobacco: Never Used  Substance and Sexual Activity  . Alcohol use: No  . Drug use: No  . Sexual activity: Not on file  Other Topics Concern  . Not on file  Social History Narrative  . Not on file   Social Determinants of Health   Financial Resource Strain:   . Difficulty of Paying Living Expenses: Not on file  Food Insecurity:   . Worried About Programme researcher, broadcasting/film/video in the Last Year: Not on file  . Ran Out of Food in the Last Year: Not on file  Transportation Needs:   . Lack of Transportation (Medical): Not on file  . Lack of Transportation (Non-Medical): Not on file  Physical Activity:   . Days of Exercise per Week: Not on file  . Minutes of Exercise per Session: Not on file  Stress:   . Feeling of Stress : Not on file  Social Connections:   . Frequency of  Communication with Friends and Family: Not on file  . Frequency of Social Gatherings with Friends and Family: Not on file  . Attends Religious Services: Not on file  . Active Member of Clubs or Organizations: Not on file  . Attends Banker Meetings: Not on file  . Marital Status: Not on file  Intimate Partner Violence:   . Fear of Current or Ex-Partner: Not on file  . Emotionally Abused: Not on file  . Physically Abused: Not on file  . Sexually Abused: Not on file     Family History  Problem Relation Age of Onset  . Hypertension Father   . Kidney cancer Father   . Lung cancer Father   . Prostate cancer Neg Hx      ROS:  Please see the history of present illness.     All other systems reviewed and negative.    Physical Exam: Blood pressure 128/78, pulse 69, height 5\' 7"  (1.702 m), weight 167 lb 3.2 oz (75.8 kg), SpO2 98 %. General: Well developed, well nourished male in no acute distress. Head: Normocephalic, atraumatic, sclera non-icteric, no xanthomas, nares are without discharge. EENT: normal  Lymph Nodes:  none Neck: Negative for carotid bruits. JVD not elevated. Back:without scoliosis kyphosis Lungs: Clear bilaterally to auscultation without wheezes, rales, or rhonchi. Breathing is unlabored. Heart: RRR with S1 S2. No  murmur . No rubs, or gallops appreciated. Abdomen: Soft, non-tender, non-distended with normoactive bowel sounds. No hepatomegaly. No rebound/guarding. No obvious abdominal masses. Msk:  Strength and tone appear normal for age. Extremities: No clubbing or cyanosis. No  edema.  Distal pedal pulses are 2+ and equal bilaterally. Skin: Warm and Dry Neuro: Alert and oriented X 3. CN III-XII intact Grossly normal sensory and motor function . Psych:  Responds to questions appropriately with a normal affect.      Labs: Cardiac Enzymes No results for input(s): CKTOTAL, CKMB, TROPONINI in the last 72 hours. CBC Lab Results  Component Value Date    WBC 15.3 (H) 04/20/2019   HGB 13.4 04/20/2019   HCT 41.4 04/20/2019   MCV 96.1 04/20/2019   PLT 492 (H) 04/20/2019   PROTIME: No results for input(s): LABPROT, INR in the last 72 hours. Chemistry No results for input(s): NA, K, CL, CO2, BUN, CREATININE, CALCIUM, PROT, BILITOT, ALKPHOS, ALT, AST, GLUCOSE in the last 168 hours.  Invalid input(s): LABALBU Lipids No results found for: CHOL, HDL, LDLCALC, TRIG BNP No results found for: PROBNP Thyroid Function Tests: No results  for input(s): TSH, T4TOTAL, T3FREE, THYROIDAB in the last 72 hours.  Invalid input(s): FREET3 Miscellaneous Lab Results  Component Value Date   DDIMER 0.39 04/19/2019    Radiology/Studies:  No results found.  EKG: Sinus at 69 Intervals 18/09/38 RSR prime Otherwise normal   Assessment and Plan:  Atrial fibrillation-paroxysmal  Hypertension  Anxiety   The patient has infrequent episodes of atrial fibrillation.  Hence, no specific chronic therapy is indicated. We did discuss the role of flecainide as a cocktail.  His father had use this.  I have given him a prescription pad to go to the emergency room with recurrent atrial fibrillation and asked him to give him flecainide 300 mg and observe.  Assuming that that is effective, then we can give him the "pill in the pocket "so as to have greater control over his atrial fibrillation which otherwise seems like sword of Damocles.  He denies snoring.  Have encouraged fitness.  Sherryl Manges

## 2019-07-20 NOTE — Patient Instructions (Addendum)
Medication Instructions:   Stop taking your Aspirin.  *If you need a refill on your cardiac medications before your next appointment, please call your pharmacy*  Dr Graciela Husbands has given pt instruction on script that if he presents to ED with Afib they are to provide pt with Flecainide 300mg  and hold for 8 hours to ensure he converts.  Lab Work: None ordered.    Testing/Procedures: None ordered.   Follow-Up: As needed with Dr  At [facility], you and your health needs are our priority.  As part of our continuing mission to provide you with exceptional heart care, we have created designated Provider Care Teams.  These Care Teams include your primary Cardiologist (physician) and Advanced Practice Providers (APPs -  Physician Assistants and Nurse Practitioners) who all work together to provide you with the care you need, when you need it.

## 2019-12-03 ENCOUNTER — Telehealth: Payer: Self-pay | Admitting: Cardiology

## 2019-12-03 NOTE — Telephone Encounter (Signed)
Spoke with the patient just now and he let me know that he has been having some very mild, intermittent pain in his left upper chest. He states that it is very dull and comes and goes. He states that it will only lasts about 3-4 seconds. He states that nothing really makes it worse, exertion does not make it worse. He states that he does not have any SOB, dizziness, sweating, or any pain in any other areas. He is requesting to be seen today but there are no openings with Dr. Servando Salina or Dr. Bing Matter for the rest of the evening. I am routing to Dr. Servando Salina for further advise.

## 2019-12-03 NOTE — Telephone Encounter (Signed)
Follow up   Talked to Rosey Bath at [facility] office she advised that Lequita Halt will return call to patient about his chest pain. Adv the patient that she will call him back.

## 2019-12-03 NOTE — Telephone Encounter (Signed)
New message   Pt c/o of Chest Pain: STAT if CP now or developed within 24 hours  1. Are you having CP right now? Yes   2. Are you experiencing any other symptoms (ex. SOB, nausea, vomiting, sweating)? No   3. How long have you been experiencing CP? Per patient started last night   4. Is your CP continuous or coming and going?coming and going   5. Have you taken Nitroglycerin? No  ?

## 2019-12-03 NOTE — Telephone Encounter (Signed)
Spoke with the patient just now and let him know that Dr. Servando Salina has recommended that he be forced into the schedule on Monday and for the patient to be seen in the ED if his chest pain were to get worse. He verbalizes understanding and does not have any other issues or concerns at this time. He has an appointment scheduled for Monday 12/06/19 at 8:20 AM.    Encouraged patient to call back with any questions or concerns.

## 2019-12-06 ENCOUNTER — Ambulatory Visit (INDEPENDENT_AMBULATORY_CARE_PROVIDER_SITE_OTHER): Payer: PRIVATE HEALTH INSURANCE | Admitting: Cardiology

## 2019-12-06 ENCOUNTER — Other Ambulatory Visit: Payer: Self-pay

## 2019-12-06 VITALS — BP 122/66 | HR 61 | Ht 67.0 in | Wt 169.0 lb

## 2019-12-06 DIAGNOSIS — R079 Chest pain, unspecified: Secondary | ICD-10-CM | POA: Diagnosis not present

## 2019-12-06 DIAGNOSIS — I10 Essential (primary) hypertension: Secondary | ICD-10-CM

## 2019-12-06 DIAGNOSIS — I480 Paroxysmal atrial fibrillation: Secondary | ICD-10-CM

## 2019-12-06 MED ORDER — NITROGLYCERIN 0.4 MG SL SUBL
0.4000 mg | SUBLINGUAL_TABLET | SUBLINGUAL | 11 refills | Status: DC | PRN
Start: 2019-12-06 — End: 2021-01-11

## 2019-12-06 NOTE — Patient Instructions (Signed)
Medication Instructions:  Your physician has recommended you make the following change in your medication:  TAKE AS NEEDED FOR CHEST PAIN: Nitroglycerin 0.4 mg sublingual (under your tongue) as needed for chest pain. If experiencing chest pain, stop what you are doing and sit down. Take 1 nitroglycerin and wait 5 minutes. If chest pain continues, take another nitroglycerin and wait 5 minutes. If chest pain does not subside, take 1 more nitroglycerin and dial 911. You make take a total of 3 nitroglycerin in a 15 minute time frame.   *If you need a refill on your cardiac medications before your next appointment, please call your pharmacy*   Lab Work: Your physician recommends that you return for lab work 3-7 days before ct fasting: bmp, lipid  If you have labs (blood work) drawn today and your tests are completely normal, you will receive your results only by: Marland Kitchen MyChart Message (if you have MyChart) OR . A paper copy in the mail If you have any lab test that is abnormal or we need to change your treatment, we will call you to review the results.   Testing/Procedures: Your cardiac CT will be scheduled at one of the below locations:   [facility] [address] [place], [place] [address] [phone]  [place] [address] [place], [place] [address] [phone]  If scheduled at [facility], please arrive at the [facility] main entrance of [facility] 30 minutes prior to test start time. Proceed to the [facility] Radiology Department (first floor) to check-in and test prep.  If scheduled at [facility], please arrive 15 mins early for check-in and test prep.  Please follow these instructions carefully (unless otherwise directed):  Hold all erectile dysfunction medications at least 3 days (72 hrs) prior to test.  On the Night Before the Test: . Be sure to Drink plenty of  water. . Do not consume any caffeinated/decaffeinated beverages or chocolate 12 hours prior to your test. . Do not take any antihistamines 12 hours prior to your test.  On the Day of the Test: . Drink plenty of water. Do not drink any water within one hour of the test. . Do not eat any food 4 hours prior to the test. . You may take your regular medications prior to the test.  . Take metoprolol (Lopressor) two hours prior to test.        After the Test: . Drink plenty of water. . After receiving IV contrast, you may experience a mild flushed feeling. This is normal. . On occasion, you may experience a mild rash up to 24 hours after the test. This is not dangerous. If this occurs, you can take Benadryl 25 mg and increase your fluid intake. . If you experience trouble breathing, this can be serious. If it is severe call 911 IMMEDIATELY. If it is mild, please call our office. . If you take any of these medications: Glipizide/Metformin, Avandament, Glucavance, please do not take 48 hours after completing test unless otherwise instructed.   Once we have confirmed authorization from your insurance company, we will call you to set up a date and time for your test.   For non-scheduling related questions, please contact the cardiac imaging nurse navigator should you have any questions/concerns: Marchia Bond, Cardiac Imaging Nurse [place], Interim Cardiac Imaging Nurse [place] [facility] Direct Office Dial: [phone]   For  scheduling needs, including cancellations and rescheduling, please call [phone].     Follow-Up: At [facility], you and your health needs are our priority.  As part of our continuing mission to provide you with exceptional heart care, we have created designated Provider Care Teams.  These Care Teams include your primary Cardiologist (physician) and Advanced Practice Providers (APPs -  Physician Assistants and Nurse  Practitioners) who all work together to provide you with the care you need, when you need it.  We recommend signing up for the patient portal called "MyChart".  Sign up information is provided on this After Visit Summary.  MyChart is used to connect with patients for Virtual Visits (Telemedicine).  Patients are able to view lab/test results, encounter notes, upcoming appointments, etc.  Non-urgent messages can be sent to your provider as well.   To learn more about what you can do with MyChart, go to [email or web address].    Your next appointment:   3 month(s)  The format for your next appointment:   In Person  Provider:   Berniece Salines, DO   Other Instructions   Cardiac CT Angiogram A cardiac CT angiogram is a procedure to look at the heart and the area around the heart. It may be done to help find the cause of chest pains or other symptoms of heart disease. During this procedure, a substance called contrast dye is injected into the blood vessels in the area to be checked. A large X-ray machine, called a CT scanner, then takes detailed pictures of the heart and the surrounding area. The procedure is also sometimes called a coronary CT angiogram, coronary artery scanning, or CTA. A cardiac CT angiogram allows the health care provider to see how well blood is flowing to and from the heart. The health care provider will be able to see if there are any problems, such as:  Blockage or narrowing of the coronary arteries in the heart.  Fluid around the heart.  Signs of weakness or disease in the muscles, valves, and tissues of the heart. Tell a health care provider about:  Any allergies you have. This is especially important if you have had a previous allergic reaction to contrast dye.  All medicines you are taking, including vitamins, herbs, eye drops, creams, and over-the-counter medicines.  Any blood disorders you have.  Any surgeries you have had.  Any medical conditions you  have.  Whether you are pregnant or may be pregnant.  Any anxiety disorders, chronic pain, or other conditions you have that may increase your stress or prevent you from lying still. What are the risks? Generally, this is a safe procedure. However, problems may occur, including:  Bleeding.  Infection.  Allergic reactions to medicines or dyes.  Damage to other structures or organs.  Kidney damage from the contrast dye that is used.  Increased risk of cancer from radiation exposure. This risk is low. Talk with your health care provider about: ? The risks and benefits of testing. ? How you can receive the lowest dose of radiation. What happens before the procedure?  Wear comfortable clothing and remove any jewelry, glasses, dentures, and hearing aids.  Follow instructions from your health care provider about eating and drinking. This may include: ? For 12 hours before the procedure -- avoid caffeine. This includes tea, coffee, soda, energy drinks, and diet pills. Drink plenty of water or other fluids that do not have caffeine in them. Being well hydrated can prevent complications. ? For 4-6 hours before  the procedure -- stop eating and drinking. The contrast dye can cause nausea, but this is less likely if your stomach is empty.  Ask your health care provider about changing or stopping your regular medicines. This is especially important if you are taking diabetes medicines, blood thinners, or medicines to treat problems with erections (erectile dysfunction). What happens during the procedure?   Hair on your chest may need to be removed so that small sticky patches called electrodes can be placed on your chest. These will transmit information that helps to monitor your heart during the procedure.  An IV will be inserted into one of your veins.  You might be given a medicine to control your heart rate during the procedure. This will help to ensure that good images are obtained.  You  will be asked to lie on an exam table. This table will slide in and out of the CT machine during the procedure.  Contrast dye will be injected into the IV. You might feel warm, or you may get a metallic taste in your mouth.  You will be given a medicine called nitroglycerin. This will relax or dilate the arteries in your heart.  The table that you are lying on will move into the CT machine tunnel for the scan.  The person running the machine will give you instructions while the scans are being done. You may be asked to: ? Keep your arms above your head. ? Hold your breath. ? Stay very still, even if the table is moving.  When the scanning is complete, you will be moved out of the machine.  The IV will be removed. The procedure may vary among health care providers and hospitals. What can I expect after the procedure? After your procedure, it is common to have:  A metallic taste in your mouth from the contrast dye.  A feeling of warmth.  A headache from the nitroglycerin. Follow these instructions at home:  Take over-the-counter and prescription medicines only as told by your health care provider.  If you are told, drink enough fluid to keep your urine pale yellow. This will help to flush the contrast dye out of your body.  Most people can return to their normal activities right after the procedure. Ask your health care provider what activities are safe for you.  It is up to you to get the results of your procedure. Ask your health care provider, or the department that is doing the procedure, when your results will be ready.  Keep all follow-up visits as told by your health care provider. This is important. Contact a health care provider if:  You have any symptoms of allergy to the contrast dye. These include: ? Shortness of breath. ? Rash or hives. ? A racing heartbeat. Summary  A cardiac CT angiogram is a procedure to look at the heart and the area around the heart. It may  be done to help find the cause of chest pains or other symptoms of heart disease.  During this procedure, a large X-ray machine, called a CT scanner, takes detailed pictures of the heart and the surrounding area after a contrast dye has been injected into blood vessels in the area.  Ask your health care provider about changing or stopping your regular medicines before the procedure. This is especially important if you are taking diabetes medicines, blood thinners, or medicines to treat erectile dysfunction.  If you are told, drink enough fluid to keep your urine pale yellow. This  will help to flush the contrast dye out of your body. This information is not intended to replace advice given to you by your health care provider. Make sure you discuss any questions you have with your health care provider. Document Revised: 02/10/2019 Document Reviewed: 02/10/2019 Elsevier Patient Education  [employer].   Nitroglycerin sublingual tablets What is this medicine? NITROGLYCERIN (nye troe GLI ser in) is a type of vasodilator. It relaxes blood vessels, increasing the blood and oxygen supply to your heart. This medicine is used to relieve chest pain caused by angina. It is also used to prevent chest pain before activities like climbing stairs, going outdoors in cold weather, or sexual activity. This medicine may be used for other purposes; ask your health care provider or pharmacist if you have questions. COMMON BRAND NAME(S): Nitroquick, Nitrostat, Nitrotab What should I tell my health care provider before I take this medicine? They need to know if you have any of these conditions:  anemia  head injury, recent stroke, or bleeding in the brain  liver disease  previous heart attack  an unusual or allergic reaction to nitroglycerin, other medicines, foods, dyes, or preservatives  pregnant or trying to get pregnant  breast-feeding How should I use this medicine? Take this medicine by mouth  as needed. At the first sign of an angina attack (chest pain or tightness) place one tablet under your tongue. You can also take this medicine 5 to 10 minutes before an event likely to produce chest pain. Follow the directions on the prescription label. Let the tablet dissolve under the tongue. Do not swallow whole. Replace the dose if you accidentally swallow it. It will help if your mouth is not dry. Saliva around the tablet will help it to dissolve more quickly. Do not eat or drink, smoke or chew tobacco while a tablet is dissolving. If you are not better within 5 minutes after taking ONE dose of nitroglycerin, call 9-1-1 immediately to seek emergency medical care. Do not take more than 3 nitroglycerin tablets over 15 minutes. If you take this medicine often to relieve symptoms of angina, your doctor or health care professional may provide you with different instructions to manage your symptoms. If symptoms do not go away after following these instructions, it is important to call 9-1-1 immediately. Do not take more than 3 nitroglycerin tablets over 15 minutes. Talk to your pediatrician regarding the use of this medicine in children. Special care may be needed. Overdosage: If you think you have taken too much of this medicine contact a poison control center or emergency room at once. NOTE: This medicine is only for you. Do not share this medicine with others. What if I miss a dose? This does not apply. This medicine is only used as needed. What may interact with this medicine? Do not take this medicine with any of the following medications:  certain migraine medicines like ergotamine and dihydroergotamine (DHE)  medicines used to treat erectile dysfunction like sildenafil, tadalafil, and vardenafil  riociguat This medicine may also interact with the following medications:  alteplase  aspirin  heparin  medicines for high blood pressure  medicines for mental depression  other medicines  used to treat angina  phenothiazines like chlorpromazine, mesoridazine, prochlorperazine, thioridazine This list may not describe all possible interactions. Give your health care provider a list of all the medicines, herbs, non-prescription drugs, or dietary supplements you use. Also tell them if you smoke, drink alcohol, or use illegal drugs. Some items may  interact with your medicine. What should I watch for while using this medicine? Tell your doctor or health care professional if you feel your medicine is no longer working. Keep this medicine with you at all times. Sit or lie down when you take your medicine to prevent falling if you feel dizzy or faint after using it. Try to remain calm. This will help you to feel better faster. If you feel dizzy, take several deep breaths and lie down with your feet propped up, or bend forward with your head resting between your knees. You may get drowsy or dizzy. Do not drive, use machinery, or do anything that needs mental alertness until you know how this drug affects you. Do not stand or sit up quickly, especially if you are an older patient. This reduces the risk of dizzy or fainting spells. Alcohol can make you more drowsy and dizzy. Avoid alcoholic drinks. Do not treat yourself for coughs, colds, or pain while you are taking this medicine without asking your doctor or health care professional for advice. Some ingredients may increase your blood pressure. What side effects may I notice from receiving this medicine? Side effects that you should report to your doctor or health care professional as soon as possible:  blurred vision  dry mouth  skin rash  sweating  the feeling of extreme pressure in the head  unusually weak or tired Side effects that usually do not require medical attention (report to your doctor or health care professional if they continue or are bothersome):  flushing of the face or neck  headache  irregular heartbeat,  palpitations  nausea, vomiting This list may not describe all possible side effects. Call your doctor for medical advice about side effects. You may report side effects to FDA at 1-800-FDA-1088. Where should I keep my medicine? Keep out of the reach of children. Store at room temperature between 20 and 25 degrees C (68 and 77 degrees F). Store in [facility] [employer]. Protect from light and moisture. Keep tightly closed. Throw away any unused medicine after the expiration date. NOTE: This sheet is a summary. It may not cover all possible information. If you have questions about this medicine, talk to your doctor, pharmacist, or health care provider.  2020 Elsevier/Gold Standard (2013-04-15 17:57:36)

## 2019-12-06 NOTE — Progress Notes (Signed)
Cardiology Office Note:    Date:  12/06/2019   ID:  KENNIETH Rose, DOB Feb 28, 1956, MRN [id number]  PCP:  Lawerance Cruel, MD  Cardiologist:  Berniece Salines, DO  Electrophysiologist:  None   Referring MD: Lawerance Cruel, MD   " I felt my heart out of rhythm on friday"  History of Present Illness:    Adrian Krutz Johnsonis a 64 y.o.malewith a hx of hypertension,hyperlipidemia,paroxysmal atrial fibrillation first diagnosed in 2014,PVC.  I saw the patient May 05, 2019 as a hospital follow-up.  At that time he had been admitted to the [facility] for atrial fibrillation.  He spontaneously converted to sinus rhythm without intervention during his hospitalization.  During our initial visit he was anticipating getting a A. fib ablation (pulmonary vein isolation) he had several questions about it those questions were answered at that time..  At the conclusion of our visit ZIO monitor was placed on the patient to understand his A. fib burden as well as his PVC burden.  Reviewed patient and discussed if which showed evidence of symptomatic PACs, frequent PVCs and paroxysmal atrial tachycardia.  It was recommended to EP and was seen by Dr. Caryl Comes July 20, 2019 at that time to discuss that the patient should go to the emergency department whenever he felt like he was in A. fib to try 300 mg x 1 and be   The patient reports that since last Thursday he started experience intermittent left-sided pinpoint chest pain.  He described it as a chest pressure which was just on his left side without radiation.  He got concerned as this has never happened to him before he took a baby aspirin which seems to have helped resolve the pain.  The pain was initially intermittent now is about to the 3 seconds and up to 30 minutes.  After the aspirin he felt better.  The next day he again started experience intermittent chest pain when he took another aspirin at this time he took some Naprosyn which  made him feel better.  He was concerned therefore he called my office on Friday asking to be seen but unfortunately were unable to see the patient and we forced him into the schedule for the day.  He denies any associated shortness of breath, lightheadedness or dizziness.  Today he does not have any chest pain.  Past Medical History:  Diagnosis Date  . Anxiety   . Arthritis   . Atrial fibrillation ([place])   . Diverticulitis   . Hiatal hernia   . History of scarlet fever   . Hyperlipemia   . Hypertension   . Palpitations   . Stomach ulcer   . SVT (supraventricular tachycardia) (HCC)    hx of    Past Surgical History:  Procedure Laterality Date  . CHOLECYSTECTOMY  2015  . EAR CANALOPLASTY  2014  . KNEE SURGERY     arthroscopic left knee  . SHOULDER SURGERY     bilateral  . SPLENECTOMY      Current Medications: Current Meds  Medication Sig  . ALPRAZolam (XANAX) 0.25 MG tablet Take 0.125 mg by mouth at bedtime as needed.   . fluticasone (FLONASE) 50 MCG/ACT nasal spray Place 1 spray into the nose 2 (two) times daily.  . Lactobacillus Rhamnosus, GG, (CULTURELLE) CAPS Take 1 capsule by mouth daily.  Marland Kitchen losartan (COZAAR) 100 MG tablet Take 50 mg by mouth daily.  . metoprolol succinate (TOPROL XL) 25 MG 24 hr tablet Take  25 mg (1 tab) in the Am and 12.5 mg (1/2 tab) In the PM  . metoprolol tartrate (LOPRESSOR) 50 MG tablet Take 1/2 tab (25 mg) daily as needed  . Multiple Vitamin (MULTIVITAMIN) tablet Take 1 tablet by mouth daily.    Marland Kitchen omeprazole (PRILOSEC) 20 MG capsule TAKE 1 CAPSULE BY MOUTH ONCE (1) DAILY  . zolpidem (AMBIEN) 10 MG tablet Take 10 mg by mouth at bedtime as needed.       Allergies:   Azithromycin   Social History   Socioeconomic History  . Marital status: Married    Spouse name: Not on file  . Number of children: 3  . Years of education: Not on file  . Highest education level: Not on file  Occupational History  . Occupation: Primary school teacher comp    Tobacco Use  . Smoking status: Never Smoker  . Smokeless tobacco: Never Used  Substance and Sexual Activity  . Alcohol use: No  . Drug use: No  . Sexual activity: Not on file  Other Topics Concern  . Not on file  Social History Narrative  . Not on file   Social Determinants of Health   Financial Resource Strain:   . Difficulty of Paying Living Expenses:   Food Insecurity:   . Worried About Charity fundraiser in the Last Year:   . Arboriculturist in the Last Year:   Transportation Needs:   . Film/video editor (Medical):   Marland Kitchen Lack of Transportation (Non-Medical):   Physical Activity:   . Days of Exercise per Week:   . Minutes of Exercise per Session:   Stress:   . Feeling of Stress :   Social Connections:   . Frequency of Communication with Friends and Family:   . Frequency of Social Gatherings with Friends and Family:   . Attends Religious Services:   . Active Member of Clubs or Organizations:   . Attends Archivist Meetings:   Marland Kitchen Marital Status:      Family History: The patient's family history includes Hypertension in his father; Kidney cancer in his father; Lung cancer in his father. There is no history of Prostate cancer.  ROS:   Review of Systems  Constitution: Negative for decreased appetite, fever and weight gain.  HENT: Negative for congestion, ear discharge, hoarse voice and sore throat.   Eyes: Negative for discharge, redness, vision loss in right eye and visual halos.  Cardiovascular: Reports chest pain, negative for dyspnea on exertion, leg swelling, orthopnea and palpitations.  Respiratory: Negative for cough, hemoptysis, shortness of breath and snoring.   Endocrine: Negative for heat intolerance and polyphagia.  Hematologic/Lymphatic: Negative for bleeding problem. Does not bruise/bleed easily.  Skin: Negative for flushing, nail changes, rash and suspicious lesions.  Musculoskeletal: Negative for arthritis, joint pain, muscle cramps,  myalgias, neck pain and stiffness.  Gastrointestinal: Negative for abdominal pain, bowel incontinence, diarrhea and excessive appetite.  Genitourinary: Negative for decreased libido, genital sores and incomplete emptying.  Neurological: Negative for brief paralysis, focal weakness, headaches and loss of balance.  Psychiatric/Behavioral: Negative for altered mental status, depression and suicidal ideas.  Allergic/Immunologic: Negative for HIV exposure and persistent infections.    EKGs/Labs/Other Studies Reviewed:    The following studies were reviewed today:   EKG:  The ekg ordered today demonstrates sinus rhythm, heart rate 60 bpm no significant change compared to prior.  TTE IMPRESSIONS  1. Left ventricular ejection fraction, by visual estimation, is 60 to  65%.  The left ventricle has normal function. Normal left ventricular size.  There is no left ventricular hypertrophy.  2. Left ventricular diastolic function could not be evaluated pattern of  LV diastolic filling.  3. Global right ventricle has normal systolic function.The right  ventricular size is normal. No increase in right ventricular wall  thickness.  4. Left atrial size was normal.  5. Right atrial size was normal.  6. The mitral valve is normal in structure. Trace mitral valve  regurgitation. No evidence of mitral stenosis.  7. The tricuspid valve is normal in structure. Tricuspid valve  regurgitation is trivial.  8. The aortic valve is tricuspid Aortic valve regurgitation is trivial by  color flow Doppler. Mild aortic valve sclerosis without stenosis.  9. The pulmonic valve was normal in structure. Pulmonic valve  regurgitation is trivial by color flow Doppler.  10. Normal pulmonary artery systolic pressure.  11. The inferior vena cava is normal in size with greater than 50%  respiratory variability, suggesting right atrial pressure of 3 mmHg.   Recent Labs: 04/20/2019: ALT 15; BUN 8; Creatinine, Ser  0.85; Hemoglobin 13.4; Magnesium 2.2; Platelets 492; Potassium 3.7; Sodium 144; TSH 0.495  Recent Lipid Panel No results found for: CHOL, TRIG, HDL, CHOLHDL, VLDL, LDLCALC, LDLDIRECT  Physical Exam:    VS:  BP 122/66   Pulse 61   Ht _0  (1.702 m)   Wt 169 lb (76.7 kg)   SpO2 97%   BMI 26.47 kg/m     Wt Readings from Last 3 Encounters:  12/06/19 169 lb (76.7 kg)  07/20/19 167 lb 3.2 oz (75.8 kg)  06/03/19 170 lb (77.1 kg)     GEN: Well nourished, well developed in no acute distress HEENT: Normal NECK: No JVD; No carotid bruits LYMPHATICS: No lymphadenopathy CARDIAC: S1S2 noted,RRR, no murmurs, rubs, gallops RESPIRATORY:  Clear to auscultation without rales, wheezing or rhonchi  ABDOMEN: Soft, non-tender, non-distended, +bowel sounds, no guarding. EXTREMITIES: No edema, No cyanosis, no clubbing MUSCULOSKELETAL:  No deformity  SKIN: Warm and dry NEUROLOGIC:  Alert and oriented x 3, non-focal PSYCHIATRIC:  Normal affect, good insight  ASSESSMENT:    1. Chest pain of uncertain etiology   2. Paroxysmal atrial fibrillation (HCC)   3. Essential hypertension    PLAN:     His chest pain is concerning therefore I like to pursue an ischemic evaluation in this patient given his risk factors.  I have discussed with the patient about this testing, he is agreeable to proceed with coronary CTA.  He has no IV contrast dye allergy.  For now he will take aspirin 81 mg daily.  Sublingual nitroglycerin prescription was sent, its protocol and 911 protocol explained and the patient vocalized understanding questions were answered to the patient's satisfaction.  Blood work has also been ordered for BMP and lipid profile.  He is in sinus rhythm today.  He has not had any recurrent atrial fibrillation episodes.  Continue the patient on metoprolol succinate.  His blood pressure is acceptable continue the patient on his losartan as well as Toprol-XL.  The patient is in agreement with the above  plan. The patient left the office in stable condition.  The patient will follow up in   Medication Adjustments/Labs and Tests Ordered: Current medicines are reviewed at length with the patient today.  Concerns regarding medicines are outlined above.  Orders Placed This Encounter  Procedures  . CT CORONARY MORPH W/CTA COR W/SCORE W/CA W/CM &/OR WO/CM  . CT CORONARY  FRACTIONAL FLOW RESERVE DATA PREP  . CT CORONARY FRACTIONAL FLOW RESERVE FLUID ANALYSIS  . Lipid panel  . Basic metabolic panel  . EKG 12-Lead   Meds ordered this encounter  Medications  . nitroGLYCERIN (NITROSTAT) 0.4 MG SL tablet    Sig: Place 1 tablet (0.4 mg total) under the tongue every 5 (five) minutes as needed for chest pain.    Dispense:  25 tablet    Refill:  11    Patient Instructions  Medication Instructions:  Your physician has recommended you make the following change in your medication:  TAKE AS NEEDED FOR CHEST PAIN: Nitroglycerin 0.4 mg sublingual (under your tongue) as needed for chest pain. If experiencing chest pain, stop what you are doing and sit down. Take 1 nitroglycerin and wait 5 minutes. If chest pain continues, take another nitroglycerin and wait 5 minutes. If chest pain does not subside, take 1 more nitroglycerin and dial 911. You make take a total of 3 nitroglycerin in a 15 minute time frame.   *If you need a refill on your cardiac medications before your next appointment, please call your pharmacy*   Lab Work: Your physician recommends that you return for lab work 3-7 days before ct fasting: bmp, lipid  If you have labs (blood work) drawn today and your tests are completely normal, you will receive your results only by: Marland Kitchen MyChart Message (if you have MyChart) OR . A paper copy in the mail If you have any lab test that is abnormal or we need to change your treatment, we will call you to review the results.   Testing/Procedures: Your cardiac CT will be scheduled at one of the below  locations:   [facility] [address] [place], [place] [address] [phone]  [facility] [address] [place], [place] [address] [phone]  If scheduled at [facility], please arrive at the [facility] main entrance of [facility] 30 minutes prior to test start time. Proceed to the [facility] Radiology Department (first floor) to check-in and test prep.  If scheduled at [facility], please arrive 15 mins early for check-in and test prep.  Please follow these instructions carefully (unless otherwise directed):  Hold all erectile dysfunction medications at least 3 days (72 hrs) prior to test.  On the Night Before the Test: . Be sure to Drink plenty of water. . Do not consume any caffeinated/decaffeinated beverages or chocolate 12 hours prior to your test. . Do not take any antihistamines 12 hours prior to your test.  On the Day of the Test: . Drink plenty of water. Do not drink any water within one hour of the test. . Do not eat any food 4 hours prior to the test. . You may take your regular medications prior to the test.  . Take metoprolol (Lopressor) two hours prior to test.        After the Test: . Drink plenty of water. . After receiving IV contrast, you may experience a mild flushed feeling. This is normal. . On occasion, you may experience a mild rash up to 24 hours after the test. This is not dangerous. If this occurs, you can take Benadryl 25 mg and increase your fluid intake. . If you experience trouble breathing, this can be serious. If it is severe call 911 IMMEDIATELY. If it is mild, please call our office. . If you take any of these medications:  Glipizide/Metformin, Avandament, Glucavance, please do not take 48 hours after completing test unless otherwise instructed.   Once we have confirmed authorization from your insurance company, we will call you  to set up a date and time for your test.   For non-scheduling related questions, please contact the cardiac imaging nurse navigator should you have any questions/concerns: Marchia Bond, Cardiac Imaging Nurse Navigator Burley Saver, Interim Cardiac Imaging Nurse [facility] Direct Office Dial: [phone]   For scheduling needs, including cancellations and rescheduling, please call [phone].     Follow-Up: At [facility], you and your health needs are our priority.  As part of our continuing mission to provide you with exceptional heart care, we have created designated Provider Care Teams.  These Care Teams include your primary Cardiologist (physician) and Advanced Practice Providers (APPs -  Physician Assistants and Nurse Practitioners) who all work together to provide you with the care you need, when you need it.  We recommend signing up for the patient portal called "MyChart".  Sign up information is provided on this After Visit Summary.  MyChart is used to connect with patients for Virtual Visits (Telemedicine).  Patients are able to view lab/test results, encounter notes, upcoming appointments, etc.  Non-urgent messages can be sent to your provider as well.   To learn more about what you can do with MyChart, go to [email or web address].    Your next appointment:   3 month(s)  The format for your next appointment:   In Person  Provider:   Berniece Salines, DO   Other Instructions   Cardiac CT Angiogram A cardiac CT angiogram is a procedure to look at the heart and the area around the heart. It may be done to help find the cause of chest pains or other symptoms of heart disease. During this procedure, a substance called contrast dye is injected into the blood vessels in the area to be checked. A large X-ray machine, called a CT scanner, then takes detailed pictures of the heart and the surrounding area. The procedure is also sometimes called a  coronary CT angiogram, coronary artery scanning, or CTA. A cardiac CT angiogram allows the health care provider to see how well blood is flowing to and from the heart. The health care provider will be able to see if there are any problems, such as:  Blockage or narrowing of the coronary arteries in the heart.  Fluid around the heart.  Signs of weakness or disease in the muscles, valves, and tissues of the heart. Tell a health care provider about:  Any allergies you have. This is especially important if you have had a previous allergic reaction to contrast dye.  All medicines you are taking, including vitamins, herbs, eye drops, creams, and over-the-counter medicines.  Any blood disorders you have.  Any surgeries you have had.  Any medical conditions you have.  Whether you are pregnant or may be pregnant.  Any anxiety disorders, chronic pain, or other conditions you have that may increase your stress or prevent you from lying still. What are the risks? Generally, this is a safe procedure. However, problems may occur, including:  Bleeding.  Infection.  Allergic reactions to medicines or dyes.  Damage to other structures or organs.  Kidney damage from the contrast dye that is used.  Increased risk of cancer from radiation exposure. This risk is low. Talk with your health care provider about: ? The risks and benefits of testing. ? How you can  receive the lowest dose of radiation. What happens before the procedure?  Wear comfortable clothing and remove any jewelry, glasses, dentures, and hearing aids.  Follow instructions from your health care provider about eating and drinking. This may include: ? For 12 hours before the procedure -- avoid caffeine. This includes tea, coffee, soda, energy drinks, and diet pills. Drink plenty of water or other fluids that do not have caffeine in them. Being well hydrated can prevent complications. ? For 4-6 hours before the procedure -- stop  eating and drinking. The contrast dye can cause nausea, but this is less likely if your stomach is empty.  Ask your health care provider about changing or stopping your regular medicines. This is especially important if you are taking diabetes medicines, blood thinners, or medicines to treat problems with erections (erectile dysfunction). What happens during the procedure?   Hair on your chest may need to be removed so that small sticky patches called electrodes can be placed on your chest. These will transmit information that helps to monitor your heart during the procedure.  An IV will be inserted into one of your veins.  You might be given a medicine to control your heart rate during the procedure. This will help to ensure that good images are obtained.  You will be asked to lie on an exam table. This table will slide in and out of the CT machine during the procedure.  Contrast dye will be injected into the IV. You might feel warm, or you may get a metallic taste in your mouth.  You will be given a medicine called nitroglycerin. This will relax or dilate the arteries in your heart.  The table that you are lying on will move into the CT machine tunnel for the scan.  The person running the machine will give you instructions while the scans are being done. You may be asked to: ? Keep your arms above your head. ? Hold your breath. ? Stay very still, even if the table is moving.  When the scanning is complete, you will be moved out of the machine.  The IV will be removed. The procedure may vary among health care providers and hospitals. What can I expect after the procedure? After your procedure, it is common to have:  A metallic taste in your mouth from the contrast dye.  A feeling of warmth.  A headache from the nitroglycerin. Follow these instructions at home:  Take over-the-counter and prescription medicines only as told by your health care provider.  If you are told, drink  enough fluid to keep your urine pale yellow. This will help to flush the contrast dye out of your body.  Most people can return to their normal activities right after the procedure. Ask your health care provider what activities are safe for you.  It is up to you to get the results of your procedure. Ask your health care provider, or the department that is doing the procedure, when your results will be ready.  Keep all follow-up visits as told by your health care provider. This is important. Contact a health care provider if:  You have any symptoms of allergy to the contrast dye. These include: ? Shortness of breath. ? Rash or hives. ? A racing heartbeat. Summary  A cardiac CT angiogram is a procedure to look at the heart and the area around the heart. It may be done to help find the cause of chest pains or other symptoms of heart disease.  During this procedure, a large X-ray machine, called a CT scanner, takes detailed pictures of the heart and the surrounding area after a contrast dye has been injected into blood vessels in the area.  Ask your health care provider about changing or stopping your regular medicines before the procedure. This is especially important if you are taking diabetes medicines, blood thinners, or medicines to treat erectile dysfunction.  If you are told, drink enough fluid to keep your urine pale yellow. This will help to flush the contrast dye out of your body. This information is not intended to replace advice given to you by your health care provider. Make sure you discuss any questions you have with your health care provider. Document Revised: 02/10/2019 Document Reviewed: 02/10/2019 Elsevier Patient Education  [facility].   Nitroglycerin sublingual tablets What is this medicine? NITROGLYCERIN (nye troe GLI ser in) is a type of vasodilator. It relaxes blood vessels, increasing the blood and oxygen supply to your heart. This medicine is used to relieve  chest pain caused by angina. It is also used to prevent chest pain before activities like climbing stairs, going outdoors in cold weather, or sexual activity. This medicine may be used for other purposes; ask your health care provider or pharmacist if you have questions. COMMON BRAND NAME(S): Nitroquick, Nitrostat, Nitrotab What should I tell my health care provider before I take this medicine? They need to know if you have any of these conditions:  anemia  head injury, recent stroke, or bleeding in the brain  liver disease  previous heart attack  an unusual or allergic reaction to nitroglycerin, other medicines, foods, dyes, or preservatives  pregnant or trying to get pregnant  breast-feeding How should I use this medicine? Take this medicine by mouth as needed. At the first sign of an angina attack (chest pain or tightness) place one tablet under your tongue. You can also take this medicine 5 to 10 minutes before an event likely to produce chest pain. Follow the directions on the prescription label. Let the tablet dissolve under the tongue. Do not swallow whole. Replace the dose if you accidentally swallow it. It will help if your mouth is not dry. Saliva around the tablet will help it to dissolve more quickly. Do not eat or drink, smoke or chew tobacco while a tablet is dissolving. If you are not better within 5 minutes after taking ONE dose of nitroglycerin, call 9-1-1 immediately to seek emergency medical care. Do not take more than 3 nitroglycerin tablets over 15 minutes. If you take this medicine often to relieve symptoms of angina, your doctor or health care professional may provide you with different instructions to manage your symptoms. If symptoms do not go away after following these instructions, it is important to call 9-1-1 immediately. Do not take more than 3 nitroglycerin tablets over 15 minutes. Talk to your pediatrician regarding the use of this medicine in children. Special  care may be needed. Overdosage: If you think you have taken too much of this medicine contact a poison control center or emergency room at once. NOTE: This medicine is only for you. Do not share this medicine with others. What if I miss a dose? This does not apply. This medicine is only used as needed. What may interact with this medicine? Do not take this medicine with any of the following medications:  certain migraine medicines like ergotamine and dihydroergotamine (DHE)  medicines used to treat erectile dysfunction like sildenafil, tadalafil, and vardenafil  riociguat This medicine may also interact with the following medications:  alteplase  aspirin  heparin  medicines for high blood pressure  medicines for mental depression  other medicines used to treat angina  phenothiazines like chlorpromazine, mesoridazine, prochlorperazine, thioridazine This list may not describe all possible interactions. Give your health care provider a list of all the medicines, herbs, non-prescription drugs, or dietary supplements you use. Also tell them if you smoke, drink alcohol, or use illegal drugs. Some items may interact with your medicine. What should I watch for while using this medicine? Tell your doctor or health care professional if you feel your medicine is no longer working. Keep this medicine with you at all times. Sit or lie down when you take your medicine to prevent falling if you feel dizzy or faint after using it. Try to remain calm. This will help you to feel better faster. If you feel dizzy, take several deep breaths and lie down with your feet propped up, or bend forward with your head resting between your knees. You may get drowsy or dizzy. Do not drive, use machinery, or do anything that needs mental alertness until you know how this drug affects you. Do not stand or sit up quickly, especially if you are an older patient. This reduces the risk of dizzy or fainting spells.  Alcohol can make you more drowsy and dizzy. Avoid alcoholic drinks. Do not treat yourself for coughs, colds, or pain while you are taking this medicine without asking your doctor or health care professional for advice. Some ingredients may increase your blood pressure. What side effects may I notice from receiving this medicine? Side effects that you should report to your doctor or health care professional as soon as possible:  blurred vision  dry mouth  skin rash  sweating  the feeling of extreme pressure in the head  unusually weak or tired Side effects that usually do not require medical attention (report to your doctor or health care professional if they continue or are bothersome):  flushing of the face or neck  headache  irregular heartbeat, palpitations  nausea, vomiting This list may not describe all possible side effects. Call your doctor for medical advice about side effects. You may report side effects to FDA at 1-800-FDA-1088. Where should I keep my medicine? Keep out of the reach of children. Store at room temperature between 20 and 25 degrees C (68 and 77 degrees F). Store in Chief of Staff. Protect from light and moisture. Keep tightly closed. Throw away any unused medicine after the expiration date. NOTE: This sheet is a summary. It may not cover all possible information. If you have questions about this medicine, talk to your doctor, pharmacist, or health care provider.  2020 Elsevier/Gold Standard (2013-04-15 17:57:36)     Adopting a Healthy Lifestyle.  Know what a healthy weight is for you (roughly BMI <25) and aim to maintain this   Aim for 7+ servings of fruits and vegetables daily   65-80+ fluid ounces of water or unsweet tea for healthy kidneys   Limit to max 1 drink of alcohol per day; avoid smoking/tobacco   Limit animal fats in diet for cholesterol and heart health - choose grass fed whenever available   Avoid highly processed foods, and  foods high in saturated/trans fats   Aim for low stress - take time to unwind and care for your mental health   Aim for 150 min of moderate intensity exercise weekly for heart health, and weights  twice weekly for bone health   Aim for 7-9 hours of sleep daily   When it comes to diets, agreement about the perfect plan isnt easy to find, even among the experts. Experts at the [place] developed an idea known as the Healthy Eating Plate. Just imagine a plate divided into logical, healthy portions.   The emphasis is on diet quality:   Load up on vegetables and fruits - one-half of your plate: Aim for color and variety, and remember that potatoes dont count.   Go for whole grains - one-quarter of your plate: Whole wheat, barley, wheat berries, quinoa, oats, brown rice, and foods made with them. If you want pasta, go with whole wheat pasta.   Protein power - one-quarter of your plate: Fish, chicken, beans, and nuts are all healthy, versatile protein sources. Limit red meat.   The diet, however, does go beyond the plate, offering a few other suggestions.   Use healthy plant oils, such as olive, canola, soy, corn, sunflower and peanut. Check the labels, and avoid partially hydrogenated oil, which have unhealthy trans fats.   If youre thirsty, drink water. Coffee and tea are good in moderation, but skip sugary drinks and limit milk and dairy products to one or two daily servings.   The type of carbohydrate in the diet is more important than the amount. Some sources of carbohydrates, such as vegetables, fruits, whole grains, and beans-are healthier than others.   Finally, stay active  Signed, Berniece Salines, DO  12/06/2019 11:31 PM    [facility]

## 2020-01-04 LAB — LIPID PANEL
Chol/HDL Ratio: 4.4 ratio (ref 0.0–5.0)
Cholesterol, Total: 186 mg/dL (ref 100–199)
HDL: 42 mg/dL (ref >39)
LDL Chol Calc (NIH): 127 mg/dL — ABNORMAL HIGH (ref 0–99)
Triglycerides: 90 mg/dL (ref 0–149)
VLDL Cholesterol Cal: 17 mg/dL (ref 5–40)

## 2020-01-04 LAB — BASIC METABOLIC PANEL
BUN/Creatinine Ratio: 11 (ref 10–24)
BUN: 11 mg/dL (ref 8–27)
CO2: 25 mmol/L (ref 20–29)
Calcium: 9.4 mg/dL (ref 8.6–10.2)
Chloride: 104 mmol/L (ref 96–106)
Creatinine, Ser: 1.03 mg/dL (ref 0.76–1.27)
GFR calc Af Amer: 90 mL/min/{1.73_m2} (ref >59)
GFR calc non Af Amer: 78 mL/min/{1.73_m2} (ref >59)
Glucose: 94 mg/dL (ref 65–99)
Potassium: 4.4 mmol/L (ref 3.5–5.2)
Sodium: 142 mmol/L (ref 134–144)

## 2020-01-05 ENCOUNTER — Telehealth: Payer: Self-pay

## 2020-01-05 MED ORDER — ROSUVASTATIN CALCIUM 5 MG PO TABS
5.0000 mg | ORAL_TABLET | Freq: Every day | ORAL | 3 refills | Status: DC
Start: 2020-01-05 — End: 2021-01-11

## 2020-01-05 NOTE — Telephone Encounter (Signed)
Spoke with patient regarding results and recommendation.  Patient verbalizes understanding and is agreeable to plan of care. Advised patient to call back with any issues or concerns.  

## 2020-01-05 NOTE — Telephone Encounter (Signed)
-----   Message from Thomasene Ripple, DO sent at 01/05/2020  8:33 AM EDT ----- LDL is 127, this is slightly elevated ideally should be less than 100.  I like to start you on low-dose Crestor 5 mg daily.

## 2020-01-06 ENCOUNTER — Telehealth (HOSPITAL_COMMUNITY): Payer: Self-pay | Admitting: *Deleted

## 2020-01-06 NOTE — Telephone Encounter (Signed)

## 2020-01-07 ENCOUNTER — Other Ambulatory Visit: Payer: Self-pay

## 2020-01-07 ENCOUNTER — Ambulatory Visit (HOSPITAL_COMMUNITY)
Admission: RE | Admit: 2020-01-07 | Discharge: 2020-01-07 | Disposition: A | Discharge status: Home / Self Care | Payer: No Typology Code available for payment source | Source: Ambulatory Visit | Attending: Cardiology | Admitting: Cardiology

## 2020-01-07 DIAGNOSIS — R079 Chest pain, unspecified: Secondary | ICD-10-CM | POA: Diagnosis not present

## 2020-01-07 MED ORDER — IOHEXOL 350 MG/ML SOLN
80.0000 mL | Freq: Once | INTRAVENOUS | Status: AC | PRN
  Administered 2020-01-07: 80 mL via INTRAVENOUS

## 2020-01-07 MED ORDER — NITROGLYCERIN 0.4 MG SL SUBL
SUBLINGUAL_TABLET | SUBLINGUAL | Status: AC
  Filled 2020-01-07: qty 2

## 2020-01-07 MED ORDER — NITROGLYCERIN 0.4 MG SL SUBL
0.8000 mg | SUBLINGUAL_TABLET | SUBLINGUAL | Status: DC | PRN
  Administered 2020-01-07: 0.8 mg via SUBLINGUAL

## 2020-01-10 ENCOUNTER — Telehealth: Payer: Self-pay

## 2020-01-10 NOTE — Telephone Encounter (Signed)
-----   Message from Thomasene Ripple, DO sent at 01/10/2020 10:33 AM EDT ----- [name] news coronary CTA normal calcium score 0.

## 2020-01-10 NOTE — Telephone Encounter (Signed)
Spoke with patient regarding results and recommendation.  Patient verbalizes understanding and is agreeable to plan of care. Advised patient to call back with any issues or concerns.  

## 2020-02-29 ENCOUNTER — Other Ambulatory Visit: Payer: Self-pay

## 2020-02-29 ENCOUNTER — Encounter: Payer: Self-pay | Admitting: Cardiology

## 2020-02-29 ENCOUNTER — Ambulatory Visit (INDEPENDENT_AMBULATORY_CARE_PROVIDER_SITE_OTHER): Payer: PRIVATE HEALTH INSURANCE | Admitting: Cardiology

## 2020-02-29 VITALS — BP 128/82 | HR 70 | Ht 67.0 in | Wt 165.4 lb

## 2020-02-29 DIAGNOSIS — I10 Essential (primary) hypertension: Secondary | ICD-10-CM

## 2020-02-29 DIAGNOSIS — I480 Paroxysmal atrial fibrillation: Secondary | ICD-10-CM

## 2020-02-29 DIAGNOSIS — E782 Mixed hyperlipidemia: Secondary | ICD-10-CM

## 2020-02-29 HISTORY — DX: Mixed hyperlipidemia: E78.2

## 2020-02-29 MED ORDER — LOSARTAN POTASSIUM 50 MG PO TABS
50.0000 mg | ORAL_TABLET | Freq: Every day | ORAL | 1 refills | Status: DC
Start: 2020-02-29 — End: 2021-01-12

## 2020-02-29 NOTE — Progress Notes (Signed)
Cardiology Office Note:    Date:  02/29/2020   ID:  [name], DOB 07/12/1955, MRN [id number]  PCP:  Daisy Florooss, Charles Alan, MD  Cardiologist:  Thomasene RippleKardie Diandra Cimini, DO  Electrophysiologist:  None   Referring MD: Daisy Florooss, Charles Alan, MD   Follow up visit   History of Present Illness:     Adrian AlyStephen C Johnsonis a 64 y.o.malewith a hx of hypertension,hyperlipidemia,paroxysmal atrial fibrillation first diagnosed in 2014,PVC.  I saw the patient May 05, 2019 as a hospital follow-up. At that time he had [facility]. He spontaneously converted to sinus rhythm without intervention during his hospitalization. During our initial visit he was anticipating getting a A. fib ablation (pulmonary vein isolation) he had several questions about it those questions were answered at that time.. At the conclusion of our visit ZIO monitor was placed on the patient to understand his A. fib burden as well as his PVC burden.  It was recommended to EP and was seen by Dr. Graciela HusbandsKlein July 20, 2019 at that time to discuss that the patient should go to the emergency department whenever he felt like he was in A. fib to try 300 mg x 1.  I saw the patient on December 06, 2019 at that time he experiencing intermittent chest pain.  I sent him for a coronary CTA.  He had his coronary CTA on January 07, 2020 which showed 0 calcium with no evidence of coronary artery disease these results have been shared with the patient previously.  He is here today for follow-up visit.  Tells me he has been doing well from a cardiovascular standpoint.  Past Medical History:  Diagnosis Date  . Anxiety   . Arthritis   . Atrial fibrillation (HCC)   . Diverticulitis   . Hiatal hernia   . History of scarlet fever   . Hyperlipemia   . Hypertension   . Palpitations   . Stomach ulcer   . SVT (supraventricular tachycardia) (HCC)    hx of    Past Surgical History:  Procedure Laterality  Date  . CHOLECYSTECTOMY  2015  . EAR CANALOPLASTY  2014  . KNEE SURGERY     arthroscopic left knee  . SHOULDER SURGERY     bilateral  . SPLENECTOMY      Current Medications: Current Meds  Medication Sig  . ALPRAZolam (XANAX) 0.25 MG tablet Take 0.125 mg by mouth at bedtime as needed.   . fluticasone (FLONASE) 50 MCG/ACT nasal spray Place 1 spray into the nose 2 (two) times daily.  . Lactobacillus Rhamnosus, GG, (CULTURELLE) CAPS Take 1 capsule by mouth daily.  Marland Kitchen. losartan (COZAAR) 50 MG tablet Take 50 mg by mouth daily.  . metoprolol succinate (TOPROL XL) 25 MG 24 hr tablet Take 25 mg (1 tab) in the Am and 12.5 mg (1/2 tab) In the PM  . metoprolol tartrate (LOPRESSOR) 50 MG tablet Take 1/2 tab (25 mg) daily as needed  . Multiple Vitamin (MULTIVITAMIN) tablet Take 1 tablet by mouth daily.    . nitroGLYCERIN (NITROSTAT) 0.4 MG SL tablet Place 1 tablet (0.4 mg total) under the tongue every 5 (five) minutes as needed for chest pain.  Marland Kitchen. omeprazole (PRILOSEC) 20 MG capsule TAKE 1 CAPSULE BY MOUTH ONCE (1) DAILY  . zolpidem (AMBIEN) 10 MG tablet Take 10 mg by mouth at bedtime as needed.       Allergies:   Azithromycin   Social History   Socioeconomic  History  . Marital status: Married    Spouse name: Not on file  . Number of children: 3  . Years of education: Not on file  . Highest education level: Not on file  Occupational History  . Occupation: Environmental health practitioner comp  Tobacco Use  . Smoking status: Never Smoker  . Smokeless tobacco: Never Used  Vaping Use  . Vaping Use: Never used  Substance and Sexual Activity  . Alcohol use: No  . Drug use: No  . Sexual activity: Not on file  Other Topics Concern  . Not on file  Social History Narrative  . Not on file   Social Determinants of Health   Financial Resource Strain:   . Difficulty of Paying Living Expenses: Not on file  Food Insecurity:   . Worried About Programme researcher, broadcasting/film/video in the Last Year: Not on file  . Ran Out of  Food in the Last Year: Not on file  Transportation Needs:   . Lack of Transportation (Medical): Not on file  . Lack of Transportation (Non-Medical): Not on file  Physical Activity:   . Days of Exercise per Week: Not on file  . Minutes of Exercise per Session: Not on file  Stress:   . Feeling of Stress : Not on file  Social Connections:   . Frequency of Communication with Friends and Family: Not on file  . Frequency of Social Gatherings with Friends and Family: Not on file  . Attends Religious Services: Not on file  . Active Member of Clubs or Organizations: Not on file  . Attends Banker Meetings: Not on file  . Marital Status: Not on file     Family History: The patient's family history includes Hypertension in his father; Kidney cancer in his father; Lung cancer in his father. There is no history of Prostate cancer.  ROS:   Review of Systems  Constitution: Negative for decreased appetite, fever and weight gain.  HENT: Negative for congestion, ear discharge, hoarse voice and sore throat.   Eyes: Negative for discharge, redness, vision loss in right eye and visual halos.  Cardiovascular: Negative for chest pain, dyspnea on exertion, leg swelling, orthopnea and palpitations.  Respiratory: Negative for cough, hemoptysis, shortness of breath and snoring.   Endocrine: Negative for heat intolerance and polyphagia.  Hematologic/Lymphatic: Negative for bleeding problem. Does not bruise/bleed easily.  Skin: Negative for flushing, nail changes, rash and suspicious lesions.  Musculoskeletal: Negative for arthritis, joint pain, muscle cramps, myalgias, neck pain and stiffness.  Gastrointestinal: Negative for abdominal pain, bowel incontinence, diarrhea and excessive appetite.  Genitourinary: Negative for decreased libido, genital sores and incomplete emptying.  Neurological: Negative for brief paralysis, focal weakness, headaches and loss of balance.  Psychiatric/Behavioral:  Negative for altered mental status, depression and suicidal ideas.  Allergic/Immunologic: Negative for HIV exposure and persistent infections.    EKGs/Labs/Other Studies Reviewed:    The following studies were reviewed today:   EKG:  None today   CCTA IMPRESSION: 1. Calcium score 0  2.  Normal aortic root 3.5 cm  3.  Normal right dominant coronary arteries    Recent Labs: 04/20/2019: ALT 15; Hemoglobin 13.4; Magnesium 2.2; Platelets 492; TSH 0.495 01/04/2020: BUN 11; Creatinine, Ser 1.03; Potassium 4.4; Sodium 142  Recent Lipid Panel    Component Value Date/Time   CHOL 186 01/04/2020 0918   TRIG 90 01/04/2020 0918   HDL 42 01/04/2020 0918   CHOLHDL 4.4 01/04/2020 0918   LDLCALC 127 (H) 01/04/2020  1749    Physical Exam:    VS:  BP 128/82   Pulse 70   Ht 5\' 7"  (1.702 m)   Wt 165 lb 6.4 oz (75 kg)   SpO2 96%   BMI 25.91 kg/m     Wt Readings from Last 3 Encounters:  02/29/20 165 lb 6.4 oz (75 kg)  12/06/19 169 lb (76.7 kg)  07/20/19 167 lb 3.2 oz (75.8 kg)     GEN: Well nourished, well developed in no acute distress HEENT: Normal NECK: No JVD; No carotid bruits LYMPHATICS: No lymphadenopathy CARDIAC: S1S2 noted,RRR, no murmurs, rubs, gallops RESPIRATORY:  Clear to auscultation without rales, wheezing or rhonchi  ABDOMEN: Soft, non-tender, non-distended, +bowel sounds, no guarding. EXTREMITIES: No edema, No cyanosis, no clubbing MUSCULOSKELETAL:  No deformity  SKIN: Warm and dry NEUROLOGIC:  Alert and oriented x 3, non-focal PSYCHIATRIC:  Normal affect, good insight  ASSESSMENT:    1. Paroxysmal atrial fibrillation (HCC)   2. Essential hypertension   3. Mixed hyperlipidemia    PLAN:    He has been doing well from a cardiovascular standpoint.  He is very happy with his CT results we discussed this again today.  All of his questions were answered.  He has not had any episodes of atrial fibrillation he thinks that his SVTsometimes comes in and out but  has not needed extra medications.  His symptomatic PVC has been controlled mostly with his choices in diet which he thinks that his heart is very sensitive to dehydration.  He tried to stay hydrated.   He will continue on her current medication regimen for now.  We discussed  he prefers to stay off the Crestor at this time.  The patient is in agreement with the above plan. The patient left the office in stable condition.  The patient will follow up in 1 year or sooner if needed.   Medication Adjustments/Labs and Tests Ordered: Current medicines are reviewed at length with the patient today.  Concerns regarding medicines are outlined above.  No orders of the defined types were placed in this encounter.  No orders of the defined types were placed in this encounter.   Patient Instructions  Medication Instructions:   Your physician recommends that you continue on your current medications as directed. Please refer to the Current Medication list given to you today.   *If you need a refill on your cardiac medications before your next appointment, please call your pharmacy*   Lab Work: NONE ORDERED  TODAY   If you have labs (blood work) drawn today and your tests are completely normal, you will receive your results only by: 07/22/19 MyChart Message (if you have MyChart) OR . A paper copy in the mail If you have any lab test that is abnormal or we need to change your treatment, we will call you to review the results.   Testing/Procedures: NONE ORDERED  TODAY     Follow-Up: At [facility], you and your health needs are our priority.  As part of our continuing mission to provide you with exceptional heart care, we have created designated Provider Care Teams.  These Care Teams include your primary Cardiologist (physician) and Advanced Practice Providers (APPs -  Physician Assistants and Nurse Practitioners) who all work together to provide you with the care you need, when you need it.  We  recommend signing up for the patient portal called "MyChart".  Sign up information is provided on this After Visit Summary.  MyChart is  used to connect with patients for Virtual Visits (Telemedicine).  Patients are able to view lab/test results, encounter notes, upcoming appointments, etc.  Non-urgent messages can be sent to your provider as well.   To learn more about what you can do with MyChart, go to [email or web address].    Your next appointment:   1 year(s)  The format for your next appointment:   In Person  Provider:   You will see Thomasene Ripple, DO.  Or, you can be scheduled with the following Advanced Practice Provider on your designated Care Team (at our [facility]):  Gillian Shields, FNP     Other Instructions      Adopting a Healthy Lifestyle.  Know what a healthy weight is for you (roughly BMI <25) and aim to maintain this   Aim for 7+ servings of fruits and vegetables daily   65-80+ fluid ounces of water or unsweet tea for healthy kidneys   Limit to max 1 drink of alcohol per day; avoid smoking/tobacco   Limit animal fats in diet for cholesterol and heart health - choose grass fed whenever available   Avoid highly processed foods, and foods high in saturated/trans fats   Aim for low stress - take time to unwind and care for your mental health   Aim for 150 min of moderate intensity exercise weekly for heart health, and weights twice weekly for bone health   Aim for 7-9 hours of sleep daily   When it comes to diets, agreement about the perfect plan isnt easy to find, even among the experts. Experts at the [facility] [employer] developed an idea known as the Healthy Eating Plate. Just imagine a plate divided into logical, healthy portions.   The emphasis is on diet quality:   Load up on vegetables and fruits - one-half of your plate: Aim for color and variety, and remember that potatoes dont count.   Go for whole grains - one-quarter of  your plate: Whole wheat, barley, wheat berries, quinoa, oats, brown rice, and foods made with them. If you want pasta, go with whole wheat pasta.   Protein power - one-quarter of your plate: Fish, chicken, beans, and nuts are all healthy, versatile protein sources. Limit red meat.   The diet, however, does go beyond the plate, offering a few other suggestions.   Use healthy plant oils, such as olive, canola, soy, corn, sunflower and peanut. Check the labels, and avoid partially hydrogenated oil, which have unhealthy trans fats.   If youre thirsty, drink water. Coffee and tea are good in moderation, but skip sugary drinks and limit milk and dairy products to one or two daily servings.   The type of carbohydrate in the diet is more important than the amount. Some sources of carbohydrates, such as vegetables, fruits, whole grains, and beans-are healthier than others.   Finally, stay active  Signed, Thomasene Ripple, DO  02/29/2020 5:00 PM    [facility]

## 2020-02-29 NOTE — Patient Instructions (Signed)
Medication Instructions:  Your physician recommends that you continue on your current medications as directed. Please refer to the Current Medication list given to you today.  *If you need a refill on your cardiac medications before your next appointment, please call your pharmacy*   Lab Work: NONE ORDERED  TODAY   If you have labs (blood work) drawn today and your tests are completely normal, you will receive your results only by: . MyChart Message (if you have MyChart) OR . A paper copy in the mail If you have any lab test that is abnormal or we need to change your treatment, we will call you to review the results.   Testing/Procedures: NONE ORDERED  TODAY   Follow-Up: At [facility], you and your health needs are our priority.  As part of our continuing mission to provide you with exceptional heart care, we have created designated Provider Care Teams.  These Care Teams include your primary Cardiologist (physician) and Advanced Practice Providers (APPs -  Physician Assistants and Nurse Practitioners) who all work together to provide you with the care you need, when you need it.  We recommend signing up for the patient portal called "MyChart".  Sign up information is provided on this After Visit Summary.  MyChart is used to connect with patients for Virtual Visits (Telemedicine).  Patients are able to view lab/test results, encounter notes, upcoming appointments, etc.  Non-urgent messages can be sent to your provider as well.   To learn more about what you can do with MyChart, go to [email or web address].    Your next appointment:   1 year(s)  The format for your next appointment:   In Person  Provider:   You will see Kardie Tobb, DO.  Or, you can be scheduled with the following Advanced Practice Provider on your designated Care Team (at our [place] Office):  Caitlin Walker, FNP     Other Instructions   

## 2020-03-02 ENCOUNTER — Ambulatory Visit: Payer: PRIVATE HEALTH INSURANCE | Admitting: Cardiology

## 2020-03-02 DIAGNOSIS — H918X9 Other specified hearing loss, unspecified ear: Secondary | ICD-10-CM

## 2020-03-02 DIAGNOSIS — H7413 Adhesive middle ear disease, bilateral: Secondary | ICD-10-CM

## 2020-03-02 DIAGNOSIS — H918X3 Other specified hearing loss, bilateral: Secondary | ICD-10-CM

## 2020-03-02 HISTORY — DX: Other specified hearing loss, unspecified ear: H91.8X9

## 2020-03-02 HISTORY — DX: Other specified hearing loss, bilateral: H91.8X3

## 2020-03-02 HISTORY — DX: Adhesive middle ear disease, bilateral: H74.13

## 2020-07-16 IMAGING — CT CT HEART MORP W/ CTA COR W/ SCORE W/ CA W/CM &/OR W/O CM
4 of 7 series · 8 of 20 positions shown, 9 images · IV contrast (APPLIED)
Comparison: None.
COMPARISON: None.

Addendum:
EXAM:
OVER-READ INTERPRETATION  CT CHEST

The following report is an over-read performed by radiologist Dr.
Say Husson [REDACTED] on 01/07/2020. This
over-read does not include interpretation of cardiac or coronary
anatomy or pathology. The coronary calcium score/coronary CTA
interpretation by the cardiologist is attached.
CLINICAL DATA: Chest pain
Cardiac CTA
MEDICATIONS:
Sub lingual nitro. 4 mg
TECHNIQUE: The patient was scanned on a Siemens Force 192 scanner. Gantry
rotation speed was 250 msecs. Collimation was. 6 mm . A 120 kV
prospective scan was triggered in the ascending thoracic aorta at
140 HU's with full mA between 30-70% of the R-R interval . Average
HR during the scan was 73 bpm. The 3D data set was interpreted on a
dedicated work station using MPR, MIP and VRT modes. A total of 80
cc of contrast was used.

[Series 6: best diast 73 % · axial · 0.39mm/px · z∈[-182,-141]mm · 2 of 311 slices shown]
[im 104/311  vessel]
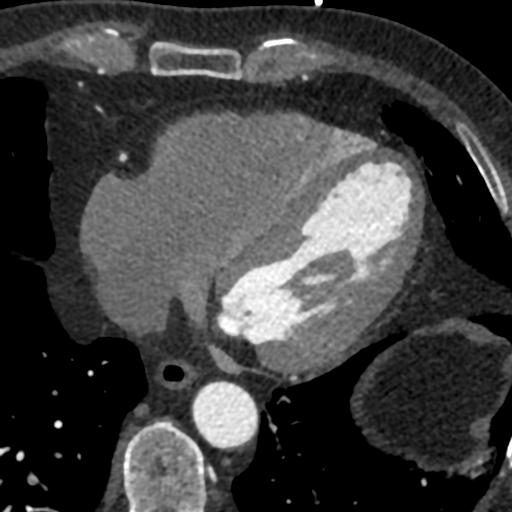
[im 207/311  vessel]
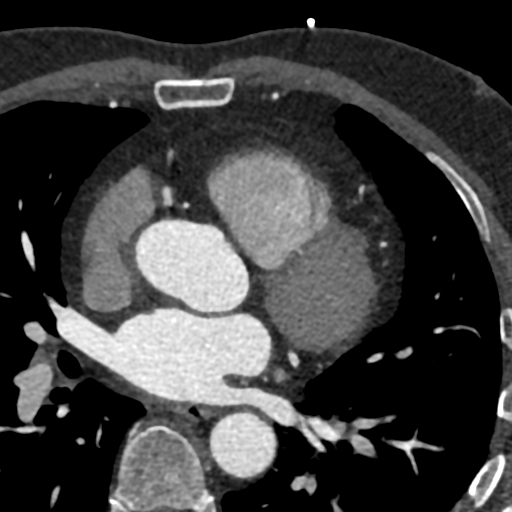

[Series 7: best syst · axial · 0.39mm/px · z∈[-182,-141]mm · 2 of 311 slices shown, 3 images]
[im 104/311  vessel]
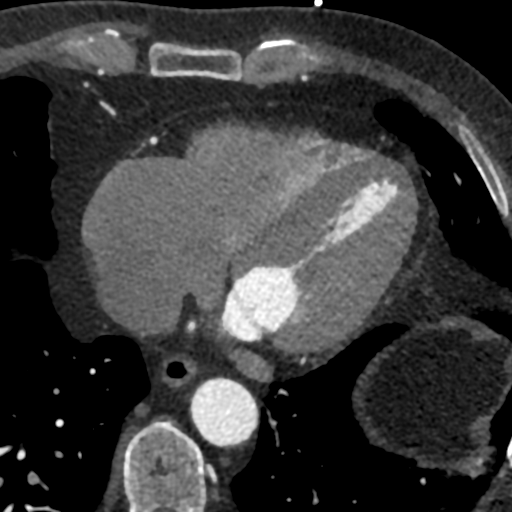
[im 104/311  lung]
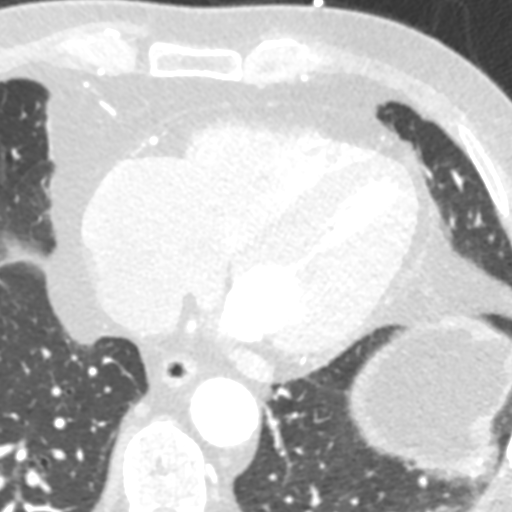
[im 207/311  vessel]
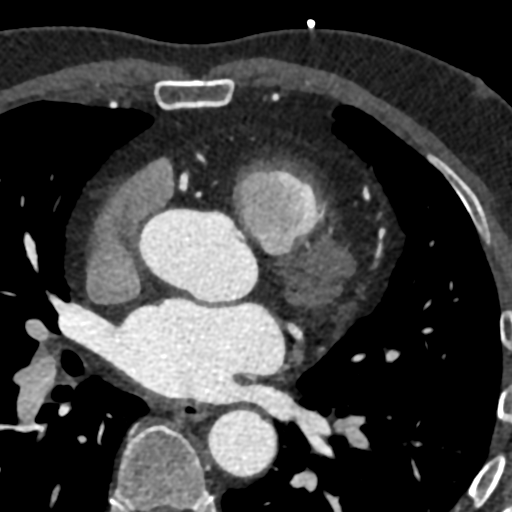

[Series 9: ts diast sharp 73 % · axial · 0.39mm/px · z∈[-182,-141]mm · 2 of 311 slices shown]
[im 104/311  lung]
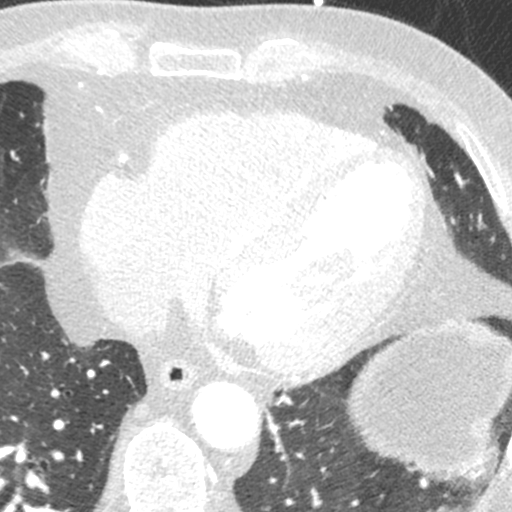
[im 207/311  lung]
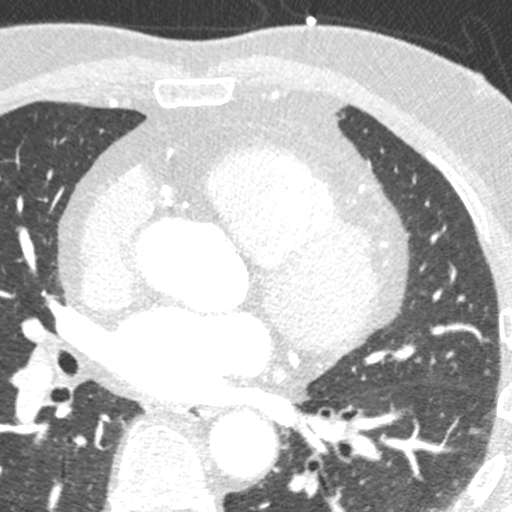

[Series 10: ts syst sharp · axial · 0.39mm/px · z∈[-182,-141]mm · 2 of 311 slices shown]
[im 104/311  lung]
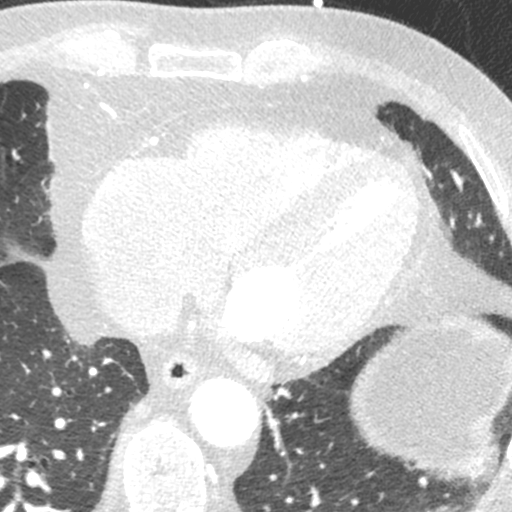
[im 207/311  lung]
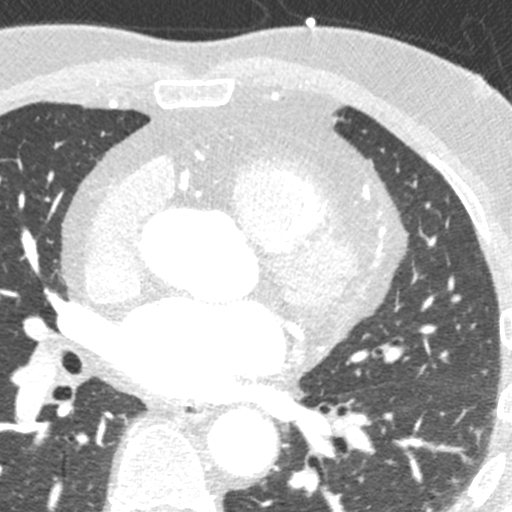

[8 of 20 positions shown; findings below may reference images not displayed]

FINDINGS: Aortic atherosclerosis. Multiple small pulmonary nodules scattered
throughout the lungs bilaterally measuring 5 mm or less in size.
Within the visualized portions of the thorax there are no other
larger suspicious appearing pulmonary nodules or masses, there is no
acute consolidative airspace disease, no pleural effusions, no
pneumothorax and no lymphadenopathy. Visualized portions of the
upper abdomen are unremarkable. There are no aggressive appearing
lytic or blastic lesions noted in the visualized portions of the
skeleton.
IMPRESSION: 1. Multiple small pulmonary nodules scattered throughout the lungs
bilaterally measuring 5 mm or less in size, nonspecific, but
statistically likely benign. No follow-up needed if patient is
low-risk (and has no known or suspected primary neoplasm).
Non-contrast chest CT can be considered in 12 months if patient is
high-risk. This recommendation follows the consensus statement:
Guidelines for Management of Incidental Pulmonary Nodules Detected
2.  Aortic Atherosclerosis (QUI0B-Q9T.T).
FINDINGS: Non-cardiac: See separate report from [REDACTED]. No
significant findings on limited lung and soft tissue windows.

Calcium score: No calcium noted

Coronary Arteries: Right dominant with no anomalies

LM: Normal

LAD: Normal

D1: Normal

D2: Normal

Circumflex: Normal

OM1: Normal

OM2: Normal

RCA: Normal

PDA: Normal

PLA: Normal
IMPRESSION: 1. Calcium score 0

2.  Normal aortic root 3.5 cm

3.  Normal right dominant coronary arteries

Gladis Ester Miel

*** End of Addendum ***
EXAM:
OVER-READ INTERPRETATION  CT CHEST

The following report is an over-read performed by radiologist Dr.
Say Husson [REDACTED] on 01/07/2020. This
over-read does not include interpretation of cardiac or coronary
anatomy or pathology. The coronary calcium score/coronary CTA
interpretation by the cardiologist is attached.
FINDINGS: Aortic atherosclerosis. Multiple small pulmonary nodules scattered
throughout the lungs bilaterally measuring 5 mm or less in size.
Within the visualized portions of the thorax there are no other
larger suspicious appearing pulmonary nodules or masses, there is no
acute consolidative airspace disease, no pleural effusions, no
pneumothorax and no lymphadenopathy. Visualized portions of the
upper abdomen are unremarkable. There are no aggressive appearing
lytic or blastic lesions noted in the visualized portions of the
skeleton.
IMPRESSION: 1. Multiple small pulmonary nodules scattered throughout the lungs
bilaterally measuring 5 mm or less in size, nonspecific, but
statistically likely benign. No follow-up needed if patient is
low-risk (and has no known or suspected primary neoplasm).
Non-contrast chest CT can be considered in 12 months if patient is
high-risk. This recommendation follows the consensus statement:
Guidelines for Management of Incidental Pulmonary Nodules Detected
2.  Aortic Atherosclerosis (QUI0B-Q9T.T).

## 2020-10-26 DIAGNOSIS — H900 Conductive hearing loss, bilateral: Secondary | ICD-10-CM

## 2020-10-26 DIAGNOSIS — H7292 Unspecified perforation of tympanic membrane, left ear: Secondary | ICD-10-CM

## 2020-10-26 DIAGNOSIS — H6123 Impacted cerumen, bilateral: Secondary | ICD-10-CM

## 2020-10-26 DIAGNOSIS — T161XXA Foreign body in right ear, initial encounter: Secondary | ICD-10-CM

## 2020-10-26 HISTORY — DX: Conductive hearing loss, bilateral: H90.0

## 2020-10-26 HISTORY — DX: Foreign body in right ear, initial encounter: T16.1XXA

## 2020-10-26 HISTORY — DX: Unspecified perforation of tympanic membrane, left ear: H72.92

## 2020-10-26 HISTORY — DX: Impacted cerumen, bilateral: H61.23

## 2021-01-11 ENCOUNTER — Other Ambulatory Visit: Payer: Self-pay

## 2021-01-11 DIAGNOSIS — K259 Gastric ulcer, unspecified as acute or chronic, without hemorrhage or perforation: Secondary | ICD-10-CM | POA: Insufficient documentation

## 2021-01-11 DIAGNOSIS — I10 Essential (primary) hypertension: Secondary | ICD-10-CM | POA: Insufficient documentation

## 2021-01-11 DIAGNOSIS — E785 Hyperlipidemia, unspecified: Secondary | ICD-10-CM | POA: Insufficient documentation

## 2021-01-11 DIAGNOSIS — F419 Anxiety disorder, unspecified: Secondary | ICD-10-CM | POA: Insufficient documentation

## 2021-01-12 ENCOUNTER — Other Ambulatory Visit: Payer: Self-pay

## 2021-01-12 ENCOUNTER — Encounter: Payer: Self-pay | Admitting: Cardiology

## 2021-01-12 ENCOUNTER — Ambulatory Visit: Payer: No Typology Code available for payment source | Admitting: Cardiology

## 2021-01-12 VITALS — BP 118/76 | HR 65 | Ht 67.0 in | Wt 154.2 lb

## 2021-01-12 DIAGNOSIS — I10 Essential (primary) hypertension: Secondary | ICD-10-CM | POA: Diagnosis not present

## 2021-01-12 DIAGNOSIS — I471 Supraventricular tachycardia: Secondary | ICD-10-CM | POA: Diagnosis not present

## 2021-01-12 DIAGNOSIS — I480 Paroxysmal atrial fibrillation: Secondary | ICD-10-CM

## 2021-01-12 MED ORDER — METOPROLOL SUCCINATE ER 25 MG PO TB24: 25.0000 mg | ORAL_TABLET | Freq: Two times a day (BID) | ORAL | 3 refills | Status: DC

## 2021-01-12 MED ORDER — METOPROLOL TARTRATE 50 MG PO TABS: 50.0000 mg | ORAL_TABLET | ORAL | 3 refills | Status: DC | PRN

## 2021-01-12 MED ORDER — LOSARTAN POTASSIUM 50 MG PO TABS
50.0000 mg | ORAL_TABLET | Freq: Every day | ORAL | 1 refills | Status: DC
Start: 2021-01-12 — End: 2024-01-09

## 2021-01-12 NOTE — Progress Notes (Signed)
Cardiology Office Note:    Date:  01/12/2021   ID:  Adrian Rose, DOB 1956-01-26, MRN [id number]  PCP:  Daisy Floro, MD  Cardiologist:  Thomasene Ripple, DO  Electrophysiologist:  None   Referring MD: Daisy Floro, MD   " I am doing well"   History of Present Illness:    Adrian Rose is a 65 y.o. male with a hx of hypertension, hyperlipidemia, paroxysmal atrial fibrillation first diagnosed in 2014, PVC.   I saw the patient May 05, 2019 as a hospital follow-up.  At that time he had been admitted to the [facility] for atrial fibrillation.  He spontaneously converted to sinus rhythm without intervention during his hospitalization.  During our initial visit he was anticipating getting a A. fib ablation (pulmonary vein isolation) he had several questions about it those questions were answered at that time..  At the conclusion of our visit ZIO monitor was placed on the patient to understand his A. fib burden as well as his PVC burden.   Reviewed patient and discussed if which showed evidence of symptomatic PACs, frequent PVCs and paroxysmal atrial tachycardia.   It was recommended to EP and was seen by Dr. Graciela Husbands July 20, 2019 at that time to discuss that the patient should go to the emergency department whenever he felt like he was in A. fib to try 300 mg x 1.  I saw the patient on December 06, 2019 at that time he experiencing intermittent chest pain.  I sent him for a coronary CTA.  He had his coronary CTA on January 07, 2020 which showed 0 calcium with no evidence of coronary artery disease these results have been shared with the patient previously.    I saw the patient on February 19, 2020 at that time we discussed his coronary CTA results which did not show any evidence of coronary artery disease during our discussion we talked about multiple possibilities which were not are reflected in my previous note.  I discussed with the patient at that time that his pain and  symptoms were not coronary artery disease in nature and given that there was some symptoms of reflecting pleurisy it was a possibility that he may have had a mild case of pericarditis which may have been due to his COVID-19 vaccines because his symptoms started after he had his vaccines.  Today he offers no complaints.  He has not had any episode of atrial fibrillation that he has felt.  No chest pain.  He is lost some weight but he was able to put some of his weight back on.  He had a mild case of COVID-19 infection back in January but is doing well.  He also had a bacterial/GI infection but has recovered.   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   Atrial fibrillation (HCC)    Atrial fibrillation with RVR (HCC) 04/19/2019   Bilateral impacted cerumen 10/26/2020   Chest pain 02/13/2018   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 04/19/2019   Conductive hearing loss, bilateral 10/26/2020   Elevated BP without diagnosis of hypertension 05/21/2016   Essential hypertension    Eustachian tube disorder, bilateral 04/19/2019   Foreign body of right middle ear 10/26/2020   GERD (gastroesophageal reflux disease) 11/27/2014   H/O sinus surgery 01/30/2015   Hiatal  hernia    History of peptic ulcer 01/30/2015   Hyperlipemia    Hypertension    Leucocytosis 04/19/2019   Mixed hyperlipidemia 02/29/2020   Palpitations    Paroxysmal atrial fibrillation (HCC) 11/27/2014   Formatting of this note might be different from the original. 11/28/14 admitted to [facility] in [place] with atrial fibrillation with RVR. He was started on IV cardizem. Converted spontaneously. troponins were negative. Echo was judged to be normal. The left and right atrial chamber size is normal. TSH normal. Formatting of this note might be different from the original. 11/27/2014 - new diagnosis Fo   Stomach  ulcer    SVT (supraventricular tachycardia) (HCC)    hx of   Tympanic membrane perforation, left 10/26/2020    Past Surgical History:  Procedure Laterality Date   CHOLECYSTECTOMY  2015   EAR CANALOPLASTY  2014   KNEE SURGERY     arthroscopic left knee   SHOULDER SURGERY     bilateral   SPLENECTOMY      Current Medications: Current Meds  Medication Sig   ALPRAZolam (XANAX) 0.25 MG tablet Take 0.125 mg by mouth at bedtime as needed for anxiety.     B Complex Vitamins (B COMPLEX-B12) TABS Take 1 tablet by mouth daily.   diphenhydrAMINE (BENADRYL) 25 mg capsule Take 25 mg by mouth at bedtime.   fluticasone (FLONASE) 50 MCG/ACT nasal spray Place 1 spray into the nose 2 (two) times daily.   Lactobacillus Rhamnosus, GG, (CULTURELLE) CAPS Take 1 capsule by mouth daily.   Multiple Vitamin (MULTIVITAMIN) tablet Take 1 tablet by mouth daily.     omeprazole (PRILOSEC) 20 MG capsule TAKE 1 CAPSULE BY MOUTH ONCE (1) DAILY   zolpidem (AMBIEN) 10 MG tablet Take 10 mg by mouth at bedtime as needed for sleep.     [DISCONTINUED] losartan (COZAAR) 50 MG tablet Take 1 tablet (50 mg total) by mouth daily.   [DISCONTINUED] metoprolol succinate (TOPROL-XL) 25 MG 24 hr tablet Take 25 mg by mouth 2 (two) times daily.   [DISCONTINUED] metoprolol tartrate (LOPRESSOR) 50 MG tablet Take 50 mg by mouth as needed for heart rate control.     Allergies:   Azithromycin and Erythromycin   Social History   Socioeconomic History   Marital status: Married    Spouse name: Not on file   Number of children: 3   Years of education: Not on file   Highest education level: Not on file  Occupational History   Occupation: nurse workman's comp  Tobacco Use   Smoking status: Never   Smokeless tobacco: Never  Vaping Use   Vaping Use: Never used  Substance and Sexual Activity   Alcohol use: No   Drug use: No   Sexual activity: Not on file  Other Topics Concern   Not on file  Social History Narrative   Not on  file   Social Determinants of Health   Financial Resource Strain: Not on file  Food Insecurity: Not on file  Transportation Needs: Not on file  Physical Activity: Not on file  Stress: Not on file  Social Connections: Not on file     Family History: The patient's family history includes Hypertension in his father; Kidney cancer in his father; Lung cancer in his father. There is no history of Prostate cancer.  ROS:   Review of Systems  Constitution: Negative for decreased appetite, fever and weight gain.  HENT: Negative for congestion, ear discharge, hoarse voice and sore throat.  Eyes: Negative for discharge, redness, vision loss in right eye and visual halos.  Cardiovascular: Negative for chest pain, dyspnea on exertion, leg swelling, orthopnea and palpitations.  Respiratory: Negative for cough, hemoptysis, shortness of breath and snoring.   Endocrine: Negative for heat intolerance and polyphagia.  Hematologic/Lymphatic: Negative for bleeding problem. Does not bruise/bleed easily.  Skin: Negative for flushing, nail changes, rash and suspicious lesions.  Musculoskeletal: Negative for arthritis, joint pain, muscle cramps, myalgias, neck pain and stiffness.  Gastrointestinal: Negative for abdominal pain, bowel incontinence, diarrhea and excessive appetite.  Genitourinary: Negative for decreased libido, genital sores and incomplete emptying.  Neurological: Negative for brief paralysis, focal weakness, headaches and loss of balance.  Psychiatric/Behavioral: Negative for altered mental status, depression and suicidal ideas.  Allergic/Immunologic: Negative for HIV exposure and persistent infections.    EKGs/Labs/Other Studies Reviewed:    The following studies were reviewed today:   EKG:  The ekg ordered today demonstrates sinus rhythm, heart rate 65 beats a minute with arrhythmia.  Coronary CTA July 2021 IMPRESSION: 1. Calcium score 0   2.  Normal aortic root 3.5 cm   3.   Normal right dominant coronary arteries  October 2020 TTE IMPRESSIONS   1. Left ventricular ejection fraction, by visual estimation, is 60 to  65%. The left ventricle has normal function. Normal left ventricular size.  There is no left ventricular hypertrophy.   2. Left ventricular diastolic function could not be evaluated pattern of  LV diastolic filling.   3. Global right ventricle has normal systolic function.The right  ventricular size is normal. No increase in right ventricular wall  thickness.   4. Left atrial size was normal.   5. Right atrial size was normal.   6. The mitral valve is normal in structure. Trace mitral valve  regurgitation. No evidence of mitral stenosis.   7. The tricuspid valve is normal in structure. Tricuspid valve  regurgitation is trivial.   8. The aortic valve is tricuspid Aortic valve regurgitation is trivial by  color flow Doppler. Mild aortic valve sclerosis without stenosis.   9. The pulmonic valve was normal in structure. Pulmonic valve  regurgitation is trivial by color flow Doppler.  10. Normal pulmonary artery systolic pressure.  11. The inferior vena cava is normal in size with greater than 50%  respiratory variability, suggesting right atrial pressure of 3 mmHg.  Recent Labs: No results found for requested labs within last 8760 hours.  Recent Lipid Panel    Component Value Date/Time   CHOL 186 01/04/2020 0918   TRIG 90 01/04/2020 0918   HDL 42 01/04/2020 0918   CHOLHDL 4.4 01/04/2020 0918   LDLCALC 127 (H) 01/04/2020 0918    Physical Exam:    VS:  BP 118/76   Pulse 65   Ht  (1.702 m)   Wt 154 lb 3.2 oz (69.9 kg)   SpO2 98%   BMI 24.15 kg/m     Wt Readings from Last 3 Encounters:  01/12/21 154 lb 3.2 oz (69.9 kg)  02/29/20 165 lb 6.4 oz (75 kg)  12/06/19 169 lb (76.7 kg)     GEN: Well nourished, well developed in no acute distress HEENT: Normal NECK: No JVD; No carotid bruits LYMPHATICS: No lymphadenopathy CARDIAC:  S1S2 noted,RRR, no murmurs, rubs, gallops RESPIRATORY:  Clear to auscultation without rales, wheezing or rhonchi  ABDOMEN: Soft, non-tender, non-distended, +bowel sounds, no guarding. EXTREMITIES: No edema, No cyanosis, no clubbing MUSCULOSKELETAL:  No deformity  SKIN: Warm and dry  NEUROLOGIC:  Alert and oriented x 3, non-focal PSYCHIATRIC:  Normal affect, good insight  ASSESSMENT:    1. SVT (supraventricular tachycardia) (HCC)   2. Paroxysmal atrial fibrillation (HCC)   3. Essential hypertension    PLAN:    He appears to be doing well from a cardiovascular standpoint.  No changes will be made to his medication regimen today.    Blood pressure is acceptable, continue with current antihypertensive regimen.  His most recent lipid profile in October 2021 showed HDL 41, LDL 91, total cholesterol 161159, triglyceride 153.  We discussed okay for the patient to remain off statins he has really optimize his diet.  The patient is in agreement with the above plan. The patient left the office in stable condition.  The patient will follow up in 1 year or sooner if needed.  Medication Adjustments/Labs and Tests Ordered: Current medicines are reviewed at length with the patient today.  Concerns regarding medicines are outlined above.  Orders Placed This Encounter  Procedures   EKG 12-Lead    Meds ordered this encounter  Medications   losartan (COZAAR) 50 MG tablet    Sig: Take 1 tablet (50 mg total) by mouth daily.    Dispense:  90 tablet    Refill:  1   metoprolol succinate (TOPROL-XL) 25 MG 24 hr tablet    Sig: Take 1 tablet (25 mg total) by mouth 2 (two) times daily.    Dispense:  180 tablet    Refill:  3   metoprolol tartrate (LOPRESSOR) 50 MG tablet    Sig: Take 1 tablet (50 mg total) by mouth as needed.    Dispense:  60 tablet    Refill:  3     Patient Instructions  Medication Instructions:  Your physician recommends that you continue on your current medications as directed.  Please refer to the Current Medication list given to you today.  *If you need a refill on your cardiac medications before your next appointment, please call your pharmacy*   Lab Work: None If you have labs (blood work) drawn today and your tests are completely normal, you will receive your results only by: MyChart Message (if you have MyChart) OR A paper copy in the mail If you have any lab test that is abnormal or we need to change your treatment, we will call you to review the results.   Testing/Procedures: None   Follow-Up: At [facility], you and your health needs are our priority.  As part of our continuing mission to provide you with exceptional heart care, we have created designated Provider Care Teams.  These Care Teams include your primary Cardiologist (physician) and Advanced Practice Providers (APPs -  Physician Assistants and Nurse Practitioners) who all work together to provide you with the care you need, when you need it.  We recommend signing up for the patient portal called "MyChart".  Sign up information is provided on this After Visit Summary.  MyChart is used to connect with patients for Virtual Visits (Telemedicine).  Patients are able to view lab/test results, encounter notes, upcoming appointments, etc.  Non-urgent messages can be sent to your provider as well.   To learn more about what you can do with MyChart, go to [email or web address].    Your next appointment:   12 month(s)  The format for your next appointment:   In Person  Provider:   [facility] [address] - Thomasene RippleKardie Christop Hippert, DO    Other Instructions    Adopting a Healthy Lifestyle.  Know what a healthy weight is for you (roughly BMI <25) and aim to maintain this   Aim for 7+ servings of fruits and vegetables daily   65-80+ fluid ounces of water or unsweet tea for healthy kidneys   Limit to max 1 drink of alcohol per day; avoid smoking/tobacco   Limit animal fats in diet for cholesterol and heart  health - choose grass fed whenever available   Avoid highly processed foods, and foods high in saturated/trans fats   Aim for low stress - take time to unwind and care for your mental health   Aim for 150 min of moderate intensity exercise weekly for heart health, and weights twice weekly for bone health   Aim for 7-9 hours of sleep daily   When it comes to diets, agreement about the perfect plan isnt easy to find, even among the experts. Experts at the [facility] of [employer] developed an idea known as the Healthy Eating Plate. Just imagine a plate divided into logical, healthy portions.   The emphasis is on diet quality:   Load up on vegetables and fruits - one-half of your plate: Aim for color and variety, and remember that potatoes dont count.   Go for whole grains - one-quarter of your plate: Whole wheat, barley, wheat berries, quinoa, oats, brown rice, and foods made with them. If you want pasta, go with whole wheat pasta.   Protein power - one-quarter of your plate: Fish, chicken, beans, and nuts are all healthy, versatile protein sources. Limit red meat.   The diet, however, does go beyond the plate, offering a few other suggestions.   Use healthy plant oils, such as olive, canola, soy, corn, sunflower and peanut. Check the labels, and avoid partially hydrogenated oil, which have unhealthy trans fats.   If youre thirsty, drink water. Coffee and tea are good in moderation, but skip sugary drinks and limit milk and dairy products to one or two daily servings.   The type of carbohydrate in the diet is more important than the amount. Some sources of carbohydrates, such as vegetables, fruits, whole grains, and beans-are healthier than others.   Finally, stay active  Signed, Thomasene Ripple, DO  01/12/2021 4:08 PM    [facility]

## 2021-01-12 NOTE — Patient Instructions (Signed)
Medication Instructions:  Your physician recommends that you continue on your current medications as directed. Please refer to the Current Medication list given to you today.  *If you need a refill on your cardiac medications before your next appointment, please call your pharmacy*   Lab Work: None If you have labs (blood work) drawn today and your tests are completely normal, you will receive your results only by: MyChart Message (if you have MyChart) OR A paper copy in the mail If you have any lab test that is abnormal or we need to change your treatment, we will call you to review the results.   Testing/Procedures: None   Follow-Up: At [facility], you and your health needs are our priority.  As part of our continuing mission to provide you with exceptional heart care, we have created designated Provider Care Teams.  These Care Teams include your primary Cardiologist (physician) and Advanced Practice Providers (APPs -  Physician Assistants and Nurse Practitioners) who all work together to provide you with the care you need, when you need it.  We recommend signing up for the patient portal called "MyChart".  Sign up information is provided on this After Visit Summary.  MyChart is used to connect with patients for Virtual Visits (Telemedicine).  Patients are able to view lab/test results, encounter notes, upcoming appointments, etc.  Non-urgent messages can be sent to your provider as well.   To learn more about what you can do with MyChart, go to [email or web address].    Your next appointment:   12 month(s)  The format for your next appointment:   In Person  Provider:   [facility] [address] - Kardie Tobb, DO    Other Instructions   

## 2022-01-18 DIAGNOSIS — H7192 Unspecified cholesteatoma, left ear: Secondary | ICD-10-CM | POA: Insufficient documentation

## 2022-07-29 ENCOUNTER — Telehealth: Payer: Self-pay | Admitting: Internal Medicine

## 2022-07-29 NOTE — Telephone Encounter (Signed)
Patient c/o Palpitations:  High priority if patient c/o lightheadedness, shortness of breath, or chest pain  How long have you had palpitations/irregular HR/ Afib? Are you having the symptoms now? About a week and not currently having issue, but took an extra metoprolol tartrate.   Are you currently experiencing lightheadedness, SOB or CP? No  Do you have a history of afib (atrial fibrillation) or irregular heart rhythm? Yes  Have you checked your BP or HR? (document readings if available): 500 systolic and HR 39 when he was having issues before he took his extra med. Current HR 78  Are you experiencing any other symptoms?  No.

## 2022-07-29 NOTE — Telephone Encounter (Signed)
Pt called back per message received in [place] Triage.    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  His normal HR is between 60 - 72 bpm.  He recently has had to take an extra 25 mg of Metoprolol Tartrate control his irregular heart rhythm.  The last occurrence happened today, 07/29/2022 at 210 pm.  He states he does NOT have chest pain, shortness of breath, dizziness or diaphoresis.  He has had to do this 3 - 4 times the past few weeks and is requesting a provider visit with cardiologist, Dr. Berniece Salines.   Pt was asymptomatic at time of call.  Pt advised to continue monitoring his symptoms, and that I would message scheduling to set up an appointment with Dr. Harriet Masson.   Pt is requesting a call tomorrow, 07/30/2022 after 900 am if at all possible to set up this appointment.   I will forward this note to [employer] scheduling.  Follow up required.

## 2022-08-26 ENCOUNTER — Encounter: Payer: Self-pay | Admitting: Cardiology

## 2022-08-26 ENCOUNTER — Ambulatory Visit: Payer: 59 | Attending: Cardiology | Admitting: Cardiology

## 2022-08-26 VITALS — BP 160/94 | HR 58 | Ht 67.0 in | Wt 164.0 lb

## 2022-08-26 DIAGNOSIS — Z79899 Other long term (current) drug therapy: Secondary | ICD-10-CM

## 2022-08-26 DIAGNOSIS — I10 Essential (primary) hypertension: Secondary | ICD-10-CM | POA: Diagnosis not present

## 2022-08-26 NOTE — Progress Notes (Signed)
Cardiology Office Note:    Date:  08/26/2022   ID:  Adrian Rose, DOB 11/19/1955, MRN [id number]  PCP:  Lawerance Cruel, MD  Cardiologist:  Berniece Salines, DO  Electrophysiologist:  None   Referring MD: Lawerance Cruel, MD   " I am doing well"   History of Present Illness:    Adrian Rose is a 67 y.o. male with a hx of hypertension, hyperlipidemia, paroxysmal atrial fibrillation first diagnosed in 2014, PVC.   I saw the patient May 05, 2019 as a hospital follow-up.  At that time he had been admitted to the [facility] for atrial fibrillation.  He spontaneously converted to sinus rhythm without intervention during his hospitalization.  During our initial visit he was anticipating getting a A. fib ablation (pulmonary vein isolation) he had several questions about it those questions were answered at that time..  At the conclusion of our visit ZIO monitor was placed on the patient to understand his A. fib burden as well as his PVC burden.   Reviewed patient and discussed if which showed evidence of symptomatic PACs, frequent PVCs and paroxysmal atrial tachycardia.   It was recommended to EP and was seen by Dr. Caryl Comes July 20, 2019 at that time to discuss that the patient should go to the emergency department whenever he felt like he was in A. fib to try 300 mg x 1.  I saw the patient on December 06, 2019 at that time he experiencing intermittent chest pain.  I sent him for a coronary CTA.  He had his coronary CTA on January 07, 2020 which showed 0 calcium with no evidence of coronary artery disease these results have been shared with the patient previously.    I saw the patient on February 19, 2020 at that time we discussed his coronary CTA results which did not show any evidence of coronary artery disease during our discussion we talked about multiple possibilities which were not are reflected in my previous note.  I discussed with the patient at that time that his pain and  symptoms were not coronary artery disease in nature and given that there was some symptoms of reflecting pleurisy it was a possibility that he may have had a mild case of pericarditis which may have been due to his COVID-19 vaccines because his symptoms started after he had his vaccines.  His last visit with me was in July 2022.  At that time no medication changes were made.  Since I saw the patient he tells me that he has had some intermittent frequent palpitations.  But he notes that they were all PVCs related palpitations.  He tells me that he had had some chest discomfort which he started to take Delsym for.  After taking down some he felt better but soon realized that his palpitations got more frequent.     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   Atrial fibrillation (HCC)    Atrial fibrillation with RVR ([place]) 04/19/2019   Bilateral impacted cerumen 10/26/2020   Chest pain 02/13/2018   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 04/19/2019   Conductive hearing loss, bilateral 10/26/2020   Elevated BP without diagnosis of hypertension 05/21/2016   Essential hypertension    Eustachian tube disorder, bilateral 04/19/2019   Foreign body of right middle ear 10/26/2020  GERD (gastroesophageal reflux disease) 11/27/2014   H/O sinus surgery 01/30/2015   Hiatal hernia    History of peptic ulcer 01/30/2015   Hyperlipemia    Hypertension    Leucocytosis 04/19/2019   Mixed hyperlipidemia 02/29/2020   Palpitations    Paroxysmal atrial fibrillation ([place]) 11/27/2014   Formatting of this note might be different from the original. 11/28/14 admitted to [facility] in [place] with atrial fibrillation with RVR. He was started on IV cardizem. Converted spontaneously. troponins were negative. Echo was judged to be normal. The left and right atrial chamber size is normal. TSH  normal. Formatting of this note might be different from the original. 11/27/2014 - new diagnosis Fo   Stomach ulcer    SVT (supraventricular tachycardia)    hx of   Tympanic membrane perforation, left 10/26/2020    Past Surgical History:  Procedure Laterality Date   CHOLECYSTECTOMY  2015   EAR CANALOPLASTY  2014   KNEE SURGERY     arthroscopic left knee   SHOULDER SURGERY     bilateral   SPLENECTOMY      Current Medications: Current Meds  Medication Sig   ALPRAZolam (XANAX) 0.25 MG tablet Take 0.125 mg by mouth at bedtime as needed for anxiety.     B Complex Vitamins (B COMPLEX-B12) TABS Take 1 tablet by mouth daily.   diphenhydrAMINE (BENADRYL) 25 mg capsule Take 25 mg by mouth at bedtime.   fluticasone (FLONASE) 50 MCG/ACT nasal spray Place 1 spray into the nose 2 (two) times daily.   Lactobacillus Rhamnosus, GG, (CULTURELLE) CAPS Take 1 capsule by mouth daily.   losartan (COZAAR) 50 MG tablet Take 1 tablet (50 mg total) by mouth daily.   metoprolol succinate (TOPROL-XL) 25 MG 24 hr tablet Take 1 tablet (25 mg total) by mouth 2 (two) times daily.   metoprolol tartrate (LOPRESSOR) 50 MG tablet Take 1 tablet (50 mg total) by mouth as needed.   Multiple Vitamin (MULTIVITAMIN) tablet Take 1 tablet by mouth daily.     omeprazole (PRILOSEC) 20 MG capsule TAKE 1 CAPSULE BY MOUTH ONCE (1) DAILY   zolpidem (AMBIEN) 10 MG tablet Take 10 mg by mouth at bedtime as needed for sleep.       Allergies:   Azithromycin, Covid-19 (mrna) vaccine, and Erythromycin   Social History   Socioeconomic History   Marital status: Married    Spouse name: Not on file   Number of children: 3   Years of education: Not on file   Highest education level: Not on file  Occupational History   Occupation: nurse workman's comp  Tobacco Use   Smoking status: Never   Smokeless tobacco: Never  Vaping Use   Vaping Use: Never used  Substance and Sexual Activity   Alcohol use: No   Drug use: No   Sexual  activity: Not on file  Other Topics Concern   Not on file  Social History Narrative   Not on file   Social Determinants of Health   Financial Resource Strain: Not on file  Food Insecurity: Not on file  Transportation Needs: Not on file  Physical Activity: Not on file  Stress: Not on file  Social Connections: Not on file     Family History: The patient's family history includes Hypertension in his father; Kidney cancer in his father; Lung cancer in his father. There is no history of Prostate cancer.  ROS:   Review of Systems  Constitution: Negative for decreased appetite, fever and weight  gain.  HENT: Negative for congestion, ear discharge, hoarse voice and sore throat.   Eyes: Negative for discharge, redness, vision loss in right eye and visual halos.  Cardiovascular: Negative for chest pain, dyspnea on exertion, leg swelling, orthopnea and palpitations.  Respiratory: Negative for cough, hemoptysis, shortness of breath and snoring.   Endocrine: Negative for heat intolerance and polyphagia.  Hematologic/Lymphatic: Negative for bleeding problem. Does not bruise/bleed easily.  Skin: Negative for flushing, nail changes, rash and suspicious lesions.  Musculoskeletal: Negative for arthritis, joint pain, muscle cramps, myalgias, neck pain and stiffness.  Gastrointestinal: Negative for abdominal pain, bowel incontinence, diarrhea and excessive appetite.  Genitourinary: Negative for decreased libido, genital sores and incomplete emptying.  Neurological: Negative for brief paralysis, focal weakness, headaches and loss of balance.  Psychiatric/Behavioral: Negative for altered mental status, depression and suicidal ideas.  Allergic/Immunologic: Negative for HIV exposure and persistent infections.    EKGs/Labs/Other Studies Reviewed:    The following studies were reviewed today:   EKG:  The ekg ordered today demonstrates sinus rhythm, heart rate 65 beats a minute with  arrhythmia.  Coronary CTA July 2021 IMPRESSION: 1. Calcium score 0   2.  Normal aortic root 3.5 cm   3.  Normal right dominant coronary arteries  October 2020 TTE IMPRESSIONS   1. Left ventricular ejection fraction, by visual estimation, is 60 to  65%. The left ventricle has normal function. Normal left ventricular size.  There is no left ventricular hypertrophy.   2. Left ventricular diastolic function could not be evaluated pattern of  LV diastolic filling.   3. Global right ventricle has normal systolic function.The right  ventricular size is normal. No increase in right ventricular wall  thickness.   4. Left atrial size was normal.   5. Right atrial size was normal.   6. The mitral valve is normal in structure. Trace mitral valve  regurgitation. No evidence of mitral stenosis.   7. The tricuspid valve is normal in structure. Tricuspid valve  regurgitation is trivial.   8. The aortic valve is tricuspid Aortic valve regurgitation is trivial by  color flow Doppler. Mild aortic valve sclerosis without stenosis.   9. The pulmonic valve was normal in structure. Pulmonic valve  regurgitation is trivial by color flow Doppler.  10. Normal pulmonary artery systolic pressure.  11. The inferior vena cava is normal in size with greater than 50%  respiratory variability, suggesting right atrial pressure of 3 mmHg.  Recent Labs: No results found for requested labs within last 365 days.  Recent Lipid Panel    Component Value Date/Time   CHOL 186 01/04/2020 0918   TRIG 90 01/04/2020 0918   HDL 42 01/04/2020 0918   CHOLHDL 4.4 01/04/2020 0918   LDLCALC 127 (H) 01/04/2020 0918    Physical Exam:    VS:  BP (!) 160/94   Pulse (!) 58   Ht '5\' 7"'$  (1.702 m)   Wt 74.4 kg   SpO2 98%   BMI 25.69 kg/m     Wt Readings from Last 3 Encounters:  08/26/22 74.4 kg  01/12/21 69.9 kg  02/29/20 75 kg     GEN: Well nourished, well developed in no acute distress HEENT: Normal NECK: No  JVD; No carotid bruits LYMPHATICS: No lymphadenopathy CARDIAC: S1S2 noted,RRR, no murmurs, rubs, gallops RESPIRATORY:  Clear to auscultation without rales, wheezing or rhonchi  ABDOMEN: Soft, non-tender, non-distended, +bowel sounds, no guarding. EXTREMITIES: No edema, No cyanosis, no clubbing MUSCULOSKELETAL:  No deformity  SKIN:  Warm and dry NEUROLOGIC:  Alert and oriented x 3, non-focal PSYCHIATRIC:  Normal affect, good insight  ASSESSMENT:    1. Essential hypertension   2. Medication management     PLAN:    He is hypertensive in the office today blood pressure manually taken by me.  This is the first time for this magnitude of elevated blood pressure.  So what I like to do of asked the patient to get his blood pressure for the next 2 weeks once daily and upload that to [place] he is agreeable.  Reviewed his lipid profile which was done in August 2023 LDL 133, triglyceride 126, total cholesterol 205 and HDL 49.  His LDL has increased compared to 1 year ago which at that time was 91.  Will repeat lipid profile as well as LFTs.  Should this be elevated I plan to start Crestor 5 mg daily on the patient.  No medication changes today.  The patient is in agreement with the above plan. The patient left the office in stable condition.  The patient will follow up in 1 year or sooner if needed.  Medication Adjustments/Labs and Tests Ordered: Current medicines are reviewed at length with the patient today.  Concerns regarding medicines are outlined above.  Orders Placed This Encounter  Procedures   Hepatic function panel   Lipid panel   EKG 12-Lead    No orders of the defined types were placed in this encounter.    Patient Instructions  Medication Instructions:  Your physician recommends that you continue on your current medications as directed. Please refer to the Current Medication list given to you today.  *If you need a refill on your cardiac medications before your next  appointment, please call your pharmacy*  Please take your blood pressure daily for 2 weeks and send in a MyChart message. Please include heart rates.   HOW TO TAKE YOUR BLOOD PRESSURE: Rest 5 minutes before taking your blood pressure. Don't smoke or drink caffeinated beverages for at least 30 minutes before. Take your blood pressure before (not after) you eat. Sit comfortably with your back supported and both feet on the floor (don't cross your legs). Elevate your arm to heart level on a table or a desk. Use the proper sized cuff. It should fit smoothly and snugly around your bare upper arm. There should be enough room to slip a fingertip under the cuff. The bottom edge of the cuff should be 1 inch above the crease of the elbow. Ideally, take 3 measurements at one sitting and record the average.   Lab Work: Your physician recommends that you have labs drawn today: LFTs, Lipid If you have labs (blood work) drawn today and your tests are completely normal, you will receive your results only by: [place] (if you have MyChart) OR A paper copy in the mail If you have any lab test that is abnormal or we need to change your treatment, we will call you to review the results.   Testing/Procedures: None   Follow-Up: At [facility], you and your health needs are our priority.  As part of our continuing mission to provide you with exceptional heart care, we have created designated Provider Care Teams.  These Care Teams include your primary Cardiologist (physician) and Advanced Practice Providers (APPs -  Physician Assistants and Nurse Practitioners) who all work together to provide you with the care you need, when you need it.  Your next appointment:   1 year(s)  Provider:   Berniece Salines, DO      Adopting a Healthy Lifestyle.  Know what a healthy weight is for you (roughly BMI <25) and aim to maintain this   Aim for 7+ servings of fruits and vegetables daily   65-80+  fluid ounces of water or unsweet tea for healthy kidneys   Limit to max 1 drink of alcohol per day; avoid smoking/tobacco   Limit animal fats in diet for cholesterol and heart health - choose grass fed whenever available   Avoid highly processed foods, and foods high in saturated/trans fats   Aim for low stress - take time to unwind and care for your mental health   Aim for 150 min of moderate intensity exercise weekly for heart health, and weights twice weekly for bone health   Aim for 7-9 hours of sleep daily   When it comes to diets, agreement about the perfect plan isnt easy to find, even among the experts. Experts at the [employer] developed an idea known as the Healthy Eating Plate. Just imagine a plate divided into logical, healthy portions.   The emphasis is on diet quality:   Load up on vegetables and fruits - one-half of your plate: Aim for color and variety, and remember that potatoes dont count.   Go for whole grains - one-quarter of your plate: Whole wheat, barley, wheat berries, quinoa, oats, brown rice, and foods made with them. If you want pasta, go with whole wheat pasta.   Protein power - one-quarter of your plate: Fish, chicken, beans, and nuts are all healthy, versatile protein sources. Limit red meat.   The diet, however, does go beyond the plate, offering a few other suggestions.   Use healthy plant oils, such as olive, canola, soy, corn, sunflower and peanut. Check the labels, and avoid partially hydrogenated oil, which have unhealthy trans fats.   If youre thirsty, drink water. Coffee and tea are good in moderation, but skip sugary drinks and limit milk and dairy products to one or two daily servings.   The type of carbohydrate in the diet is more important than the amount. Some sources of carbohydrates, such as vegetables, fruits, whole grains, and beans-are healthier than others.   Finally, stay active  Rolly Pancake, DO   08/26/2022 12:37 PM    [facility]

## 2022-08-26 NOTE — Patient Instructions (Addendum)
Medication Instructions:  Your physician recommends that you continue on your current medications as directed. Please refer to the Current Medication list given to you today.  *If you need a refill on your cardiac medications before your next appointment, please call your pharmacy*  Please take your blood pressure daily for 2 weeks and send in a MyChart message. Please include heart rates.   HOW TO TAKE YOUR BLOOD PRESSURE: Rest 5 minutes before taking your blood pressure. Don't smoke or drink caffeinated beverages for at least 30 minutes before. Take your blood pressure before (not after) you eat. Sit comfortably with your back supported and both feet on the floor (don't cross your legs). Elevate your arm to heart level on a table or a desk. Use the proper sized cuff. It should fit smoothly and snugly around your bare upper arm. There should be enough room to slip a fingertip under the cuff. The bottom edge of the cuff should be 1 inch above the crease of the elbow. Ideally, take 3 measurements at one sitting and record the average.   Lab Work: Your physician recommends that you have labs drawn today: LFTs, Lipid If you have labs (blood work) drawn today and your tests are completely normal, you will receive your results only by: [place] (if you have MyChart) OR A paper copy in the mail If you have any lab test that is abnormal or we need to change your treatment, we will call you to review the results.   Testing/Procedures: None   Follow-Up: At [facility], you and your health needs are our priority.  As part of our continuing mission to provide you with exceptional heart care, we have created designated Provider Care Teams.  These Care Teams include your primary Cardiologist (physician) and Advanced Practice Providers (APPs -  Physician Assistants and Nurse Practitioners) who all work together to provide you with the care you need, when you need it.  Your next  appointment:   1 year(s)  Provider:   Berniece Salines, DO

## 2022-08-27 LAB — LIPID PANEL
Chol/HDL Ratio: 4.8 ratio (ref 0.0–5.0)
Cholesterol, Total: 196 mg/dL (ref 100–199)
HDL: 41 mg/dL (ref >39)
LDL Chol Calc (NIH): 119 mg/dL — ABNORMAL HIGH (ref 0–99)
Triglycerides: 202 mg/dL — ABNORMAL HIGH (ref 0–149)
VLDL Cholesterol Cal: 36 mg/dL (ref 5–40)

## 2022-08-27 LAB — HEPATIC FUNCTION PANEL
ALT: 14 IU/L (ref 0–44)
AST: 14 IU/L (ref 0–40)
Albumin: 4.3 g/dL (ref 3.9–4.9)
Alkaline Phosphatase: 83 IU/L (ref 44–121)
Bilirubin Total: 0.3 mg/dL (ref 0.0–1.2)
Bilirubin, Direct: <0.1 mg/dL (ref 0.00–0.40)
Total Protein: 6.9 g/dL (ref 6.0–8.5)

## 2023-07-31 ENCOUNTER — Encounter: Payer: Self-pay | Admitting: Cardiology

## 2023-07-31 ENCOUNTER — Ambulatory Visit: Payer: 59 | Attending: Cardiology | Admitting: Cardiology

## 2023-07-31 ENCOUNTER — Telehealth: Payer: Self-pay | Admitting: Cardiology

## 2023-07-31 VITALS — BP 132/80 | HR 62 | Ht 67.0 in | Wt 158.6 lb

## 2023-07-31 DIAGNOSIS — I480 Paroxysmal atrial fibrillation: Secondary | ICD-10-CM | POA: Diagnosis not present

## 2023-07-31 DIAGNOSIS — M79606 Pain in leg, unspecified: Secondary | ICD-10-CM

## 2023-07-31 DIAGNOSIS — I493 Ventricular premature depolarization: Secondary | ICD-10-CM | POA: Diagnosis not present

## 2023-07-31 DIAGNOSIS — I10 Essential (primary) hypertension: Secondary | ICD-10-CM

## 2023-07-31 MED ORDER — METOPROLOL SUCCINATE ER 25 MG PO TB24: ORAL_TABLET | ORAL | 3 refills | Status: DC

## 2023-07-31 NOTE — Progress Notes (Signed)
Cardiology Office Note:    Date:  08/02/2023   ID:  Adrian Rose, DOB 01-Jun-1956, MRN [id number]  PCP:  Daisy Floro, MD  Cardiologist:  Thomasene Ripple, DO  Electrophysiologist:  None   Referring MD: Daisy Floro, MD   " I am doing well"   History of Present Illness:    Adrian Rose is a 68 y.o. male with a hx of hypertension, hyperlipidemia, paroxysmal atrial fibrillation first diagnosed in 2014, PVC.   The patient describes these episodes as irritating but not significantly interfering with daily life. He reports feeling a hot burning sensation in the chest during one episode of trigeminy, which he attributed to potential anxiety. The patient also reports a recent viral upper respiratory infection, which lasted for about three weeks and resolved on its own. He did not seek testing for this infection, but his primary care physician suggested it might have been RSV. The patient also reports a small resolving blood clot in the foot, which started on January 16th. The clot was tender initially, but the pain has since resolved. The patient's primary care physician recommended taking baby aspirin for this issue.    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   Atrial fibrillation (HCC)    Atrial fibrillation with RVR (HCC) 04/19/2019   Bilateral impacted cerumen 10/26/2020   Chest pain 02/13/2018   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 04/19/2019   Conductive hearing loss, bilateral 10/26/2020   Elevated BP without diagnosis of hypertension 05/21/2016   Essential hypertension    Eustachian tube disorder, bilateral 04/19/2019   Foreign body of right middle ear 10/26/2020   GERD (gastroesophageal reflux disease) 11/27/2014   H/O sinus surgery 01/30/2015   Hiatal hernia    History of peptic ulcer 01/30/2015   Hyperlipemia     Hypertension    Leucocytosis 04/19/2019   Mixed hyperlipidemia 02/29/2020   Palpitations    Paroxysmal atrial fibrillation (HCC) 11/27/2014   Formatting of this note might be different from the original. 11/28/14 admitted to [facility] in [place] with atrial fibrillation with RVR. He was started on IV cardizem. Converted spontaneously. troponins were negative. Echo was judged to be normal. The left and right atrial chamber size is normal. TSH normal. Formatting of this note might be different from the original. 11/27/2014 - new diagnosis Fo   Stomach ulcer    SVT (supraventricular tachycardia) (HCC)    hx of   Tympanic membrane perforation, left 10/26/2020    Past Surgical History:  Procedure Laterality Date   CHOLECYSTECTOMY  2015   EAR CANALOPLASTY  2014   KNEE SURGERY     arthroscopic left knee   SHOULDER SURGERY     bilateral   SPLENECTOMY      Current Medications: Current Meds  Medication Sig   ALPRAZolam (XANAX) 0.25 MG tablet Take 0.125 mg by mouth at bedtime as needed for anxiety.     ASPIRIN 81 PO Take 81 mg of amoxicillin by mouth daily.   B Complex Vitamins (B COMPLEX-B12) TABS Take 1 tablet by mouth daily.   diphenhydrAMINE (BENADRYL) 25 mg capsule Take 25 mg by mouth at bedtime.   fluticasone (FLONASE) 50 MCG/ACT nasal spray Place 1 spray into the nose 2 (two) times daily.   Lactobacillus Rhamnosus, GG, (CULTURELLE) CAPS Take 1 capsule by mouth daily.  losartan (COZAAR) 50 MG tablet Take 1 tablet (50 mg total) by mouth daily.   metoprolol succinate (TOPROL XL) 25 MG 24 hr tablet Take 25 mg (one tablet) in the morning take 37.5 mg (one and a half tablet) at night   metoprolol tartrate (LOPRESSOR) 50 MG tablet Take 1 tablet (50 mg total) by mouth as needed.   Multiple Vitamin (MULTIVITAMIN) tablet Take 1 tablet by mouth daily.     omeprazole (PRILOSEC) 20 MG capsule TAKE 1 CAPSULE BY MOUTH ONCE (1) DAILY   zolpidem (AMBIEN) 10 MG tablet Take 10 mg by mouth at bedtime as  needed for sleep.     [DISCONTINUED] metoprolol succinate (TOPROL-XL) 25 MG 24 hr tablet Take 1 tablet (25 mg total) by mouth 2 (two) times daily.     Allergies:   Azithromycin, Covid-19 (mrna) vaccine, and Erythromycin   Social History   Socioeconomic History   Marital status: Married    Spouse name: Not on file   Number of children: 3   Years of education: Not on file   Highest education level: Not on file  Occupational History   Occupation: nurse workman's comp  Tobacco Use   Smoking status: Never   Smokeless tobacco: Never  Vaping Use   Vaping status: Never Used  Substance and Sexual Activity   Alcohol use: No   Drug use: No   Sexual activity: Not on file  Other Topics Concern   Not on file  Social History Narrative   Not on file   Social Drivers of Health   Financial Resource Strain: Not on file  Food Insecurity: Low Risk  (05/16/2023)   Received from [facility]   Hunger Vital Sign    Worried About Running Out of Food in the Last Year: Never true    Ran Out of Food in the Last Year: Never true  Transportation Needs: No Transportation Needs (05/16/2023)   Received from [facility]    In the past 12 months, has lack of reliable transportation kept you from medical appointments, meetings, work or from getting things needed for daily living? : No  Physical Activity: Not on file  Stress: Not on file  Social Connections: Unknown (11/13/2021)   Received from [facility]   Social Network    Social Network: Not on file     Family History: The patient's family history includes Hypertension in his father; Kidney cancer in his father; Lung cancer in his father. There is no history of Prostate cancer.  ROS:   Review of Systems  Constitution: Negative for decreased appetite, fever and weight gain.  HENT: Negative for congestion, ear discharge, hoarse voice and sore throat.   Eyes: Negative for discharge, redness, vision loss in  right eye and visual halos.  Cardiovascular: Negative for chest pain, dyspnea on exertion, leg swelling, orthopnea and palpitations.  Respiratory: Negative for cough, hemoptysis, shortness of breath and snoring.   Endocrine: Negative for heat intolerance and polyphagia.  Hematologic/Lymphatic: Negative for bleeding problem. Does not bruise/bleed easily.  Skin: Negative for flushing, nail changes, rash and suspicious lesions.  Musculoskeletal: Negative for arthritis, joint pain, muscle cramps, myalgias, neck pain and stiffness.  Gastrointestinal: Negative for abdominal pain, bowel incontinence, diarrhea and excessive appetite.  Genitourinary: Negative for decreased libido, genital sores and incomplete emptying.  Neurological: Negative for brief paralysis, focal weakness, headaches and loss of balance.  Psychiatric/Behavioral: Negative for altered mental status, depression and suicidal ideas.  Allergic/Immunologic: Negative for  HIV exposure and persistent infections.    EKGs/Labs/Other Studies Reviewed:    The following studies were reviewed today:   EKG:  The ekg ordered today demonstrates sinus rhythm, heart rate 65 beats a minute with arrhythmia.  Coronary CTA July 2021 IMPRESSION: 1. Calcium score 0   2.  Normal aortic root 3.5 cm   3.  Normal right dominant coronary arteries  October 2020 TTE IMPRESSIONS   1. Left ventricular ejection fraction, by visual estimation, is 60 to  65%. The left ventricle has normal function. Normal left ventricular size.  There is no left ventricular hypertrophy.   2. Left ventricular diastolic function could not be evaluated pattern of  LV diastolic filling.   3. Global right ventricle has normal systolic function.The right  ventricular size is normal. No increase in right ventricular wall  thickness.   4. Left atrial size was normal.   5. Right atrial size was normal.   6. The mitral valve is normal in structure. Trace mitral valve   regurgitation. No evidence of mitral stenosis.   7. The tricuspid valve is normal in structure. Tricuspid valve  regurgitation is trivial.   8. The aortic valve is tricuspid Aortic valve regurgitation is trivial by  color flow Doppler. Mild aortic valve sclerosis without stenosis.   9. The pulmonic valve was normal in structure. Pulmonic valve  regurgitation is trivial by color flow Doppler.  10. Normal pulmonary artery systolic pressure.  11. The inferior vena cava is normal in size with greater than 50%  respiratory variability, suggesting right atrial pressure of 3 mmHg.  Recent Labs: 08/26/2022: ALT 14  Recent Lipid Panel    Component Value Date/Time   CHOL 196 08/26/2022 1215   TRIG 202 (H) 08/26/2022 1215   HDL 41 08/26/2022 1215   CHOLHDL 4.8 08/26/2022 1215   LDLCALC 119 (H) 08/26/2022 1215    Physical Exam:    VS:  BP 132/80 (BP Location: Right Arm, Patient Position: Sitting, Cuff Size: Normal)   Pulse 62   Ht 5\' 7"  (1.702 m)   Wt 158 lb 9.6 oz (71.9 kg)   SpO2 97%   BMI 24.84 kg/m     Wt Readings from Last 3 Encounters:  07/31/23 158 lb 9.6 oz (71.9 kg)  08/26/22 164 lb (74.4 kg)  01/12/21 154 lb 3.2 oz (69.9 kg)     GEN: Well nourished, well developed in no acute distress HEENT: Normal NECK: No JVD; No carotid bruits LYMPHATICS: No lymphadenopathy CARDIAC: S1S2 noted,RRR, no murmurs, rubs, gallops RESPIRATORY:  Clear to auscultation without rales, wheezing or rhonchi  ABDOMEN: Soft, non-tender, non-distended, +bowel sounds, no guarding. EXTREMITIES: No edema, No cyanosis, no clubbing MUSCULOSKELETAL:  No deformity  SKIN: Warm and dry NEUROLOGIC:  Alert and oriented x 3, non-focal PSYCHIATRIC:  Normal affect, good insight  ASSESSMENT:    1. Essential hypertension   2. Pain of lower extremity, unspecified laterality   3. PAF (paroxysmal atrial fibrillation) (HCC)   4. PVC (premature ventricular contraction)     PLAN:     PAF, Premature  Ventricular Contractions (PVCs) and Trigeminy Patient reports feeling PVCs and experiencing episodes of trigeminy, which cause discomfort and mild exertional dyspnea. No other significant symptoms. Recent viral upper respiratory infection and development of a small resolving blood clot in the foot noted. Prior discussion on anticoagulation - prefers to hold off.   -Increase Metoprolol Succinate to 37.5 mg in the evening and continue 25mg  in the morning. -Consider ambulatory EKG monitoring  if symptoms persist or worsen.  Suspected Peripheral Venous Thrombosis Small, resolving blood clot in the foot. No current pain or tenderness. Patient has been taking baby aspirin as advised by primary care physician. -Order bilateral lower extremity Doppler ultrasound to ensure no proximal deep vein thrombosis.  Hypertension Patient reports blood pressure has been well-controlled. -Advise patient to monitor blood pressure daily at the same time for the next week, twice daily if possible. -Report any readings over 160 systolic for three consecutive days for potential medication adjustment.   Follow-up in 1 year, or sooner if any concerning symptoms arise.  The patient is in agreement with the above plan. The patient left the office in stable condition.  The patient will follow up in 1 year or sooner if needed.  Medication Adjustments/Labs and Tests Ordered: Current medicines are reviewed at length with the patient today.  Concerns regarding medicines are outlined above.  Orders Placed This Encounter  Procedures   EKG 12-Lead   VAS [place] LOWER EXTREMITY ARTERIAL DUPLEX    Meds ordered this encounter  Medications   metoprolol succinate (TOPROL XL) 25 MG 24 hr tablet    Sig: Take 25 mg (one tablet) in the morning take 37.5 mg (one and a half tablet) at night    Dispense:  45 tablet    Refill:  3     Patient Instructions  Medication Instructions:  Your physician has recommended you make the  following change in your medication:  CHANGE: Metoprolol succinate (Toprol-XL) 25 mg  (one tablet) in the morning and 37.5 mg (one and a half tablet) at night *If you need a refill on your cardiac medications before your next appointment, please call your pharmacy*   Testing/Procedures: Your physician has requested that you have a lower extremity arterial duplex. This test is an ultrasound of the arteries in the legs. It looks at arterial blood flow in the legs. Allow one hour for Lower Arterial scans. There are no restrictions or special instructions.  Please note: We ask at that you not bring children with you during ultrasound (echo/ vascular) testing. Due to room size and safety concerns, children are not allowed in the ultrasound rooms during exams. Our front office staff cannot provide observation of children in our lobby area while testing is being conducted. An adult accompanying a patient to their appointment will only be allowed in the ultrasound room at the discretion of the ultrasound technician under special circumstances. We apologize for any inconvenience.   Follow-Up: At [facility], you and your health needs are our priority.  As part of our continuing mission to provide you with exceptional heart care, we have created designated Provider Care Teams.  These Care Teams include your primary Cardiologist (physician) and Advanced Practice Providers (APPs -  Physician Assistants and Nurse Practitioners) who all work together to provide you with the care you need, when you need it.   Your next appointment:   1 year(s)  Provider:   Thomasene Ripple, DO     Other instructions: HOW TO TAKE YOUR BLOOD PRESSURE: Rest 5 minutes before taking your blood pressure. Don't smoke or drink caffeinated beverages for at least 30 minutes before. Take your blood pressure before (not after) you eat. Sit comfortably with your back supported and both feet on the floor (don't cross your  legs). Elevate your arm to heart level on a table or a desk. Use the proper sized cuff. It should fit smoothly and snugly around your bare upper  arm. There should be enough room to slip a fingertip under the cuff. The bottom edge of the cuff should be 1 inch above the crease of the elbow. Ideally, take 3 measurements at one sitting and record the average.    Adopting a Healthy Lifestyle.  Know what a healthy weight is for you (roughly BMI <25) and aim to maintain this   Aim for 7+ servings of fruits and vegetables daily   65-80+ fluid ounces of water or unsweet tea for healthy kidneys   Limit to max 1 drink of alcohol per day; avoid smoking/tobacco   Limit animal fats in diet for cholesterol and heart health - choose grass fed whenever available   Avoid highly processed foods, and foods high in saturated/trans fats   Aim for low stress - take time to unwind and care for your mental health   Aim for 150 min of moderate intensity exercise weekly for heart health, and weights twice weekly for bone health   Aim for 7-9 hours of sleep daily   When it comes to diets, agreement about the perfect plan isnt easy to find, even among the experts. Experts at the [facility] [employer] developed an idea known as the Healthy Eating Plate. Just imagine a plate divided into logical, healthy portions.   The emphasis is on diet quality:   Load up on vegetables and fruits - one-half of your plate: Aim for color and variety, and remember that potatoes dont count.   Go for whole grains - one-quarter of your plate: Whole wheat, barley, wheat berries, quinoa, oats, brown rice, and foods made with them. If you want pasta, go with whole wheat pasta.   Protein power - one-quarter of your plate: Fish, chicken, beans, and nuts are all healthy, versatile protein sources. Limit red meat.   The diet, however, does go beyond the plate, offering a few other suggestions.   Use healthy plant oils,  such as olive, canola, soy, corn, sunflower and peanut. Check the labels, and avoid partially hydrogenated oil, which have unhealthy trans fats.   If youre thirsty, drink water. Coffee and tea are good in moderation, but skip sugary drinks and limit milk and dairy products to one or two daily servings.   The type of carbohydrate in the diet is more important than the amount. Some sources of carbohydrates, such as vegetables, fruits, whole grains, and beans-are healthier than others.   Finally, stay active  Signed, Thomasene Ripple, DO  08/02/2023 10:01 AM    [facility]

## 2023-07-31 NOTE — Telephone Encounter (Signed)
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    He states that on Saturday he felt very tired, and had not had much sleep.  He saw on his Kardia mobile, PVC's.  He can definitely sense the PVC's/Trigeminy pvc and denies any other symptoms (no SOB, no CP, no dizziness).  He will see Dr. Servando Salina today at 4:00pm.

## 2023-07-31 NOTE — Telephone Encounter (Signed)
Patient c/o Palpitations:  STAT if patient reporting lightheadedness, shortness of breath, or chest pain  How long have you had palpitations/irregular HR/ Afib? Are you having the symptoms now?   No  Are you currently experiencing lightheadedness, SOB or CP?   No  Do you have a history of afib (atrial fibrillation) or irregular heart rhythm?   Yes  Have you checked your BP or HR? (document readings if available):   BP 133/82  HR 68  Are you experiencing any other symptoms?  No  Patient is concerned about his heart skipping beats.  Patient rescheduled to 1/30 at 4:00 pm.

## 2023-07-31 NOTE — Patient Instructions (Addendum)
Medication Instructions:  Your physician has recommended you make the following change in your medication:  CHANGE: Metoprolol succinate (Toprol-XL) 25 mg  (one tablet) in the morning and 37.5 mg (one and a half tablet) at night *If you need a refill on your cardiac medications before your next appointment, please call your pharmacy*   Testing/Procedures: Your physician has requested that you have a lower extremity arterial duplex. This test is an ultrasound of the arteries in the legs. It looks at arterial blood flow in the legs. Allow one hour for Lower Arterial scans. There are no restrictions or special instructions.  Please note: We ask at that you not bring children with you during ultrasound (echo/ vascular) testing. Due to room size and safety concerns, children are not allowed in the ultrasound rooms during exams. Our front office staff cannot provide observation of children in our lobby area while testing is being conducted. An adult accompanying a patient to their appointment will only be allowed in the ultrasound room at the discretion of the ultrasound technician under special circumstances. We apologize for any inconvenience.   Follow-Up: At [facility], you and your health needs are our priority.  As part of our continuing mission to provide you with exceptional heart care, we have created designated Provider Care Teams.  These Care Teams include your primary Cardiologist (physician) and Advanced Practice Providers (APPs -  Physician Assistants and Nurse Practitioners) who all work together to provide you with the care you need, when you need it.   Your next appointment:   1 year(s)  Provider:   Thomasene Ripple, DO     Other instructions: HOW TO TAKE YOUR BLOOD PRESSURE: Rest 5 minutes before taking your blood pressure. Don't smoke or drink caffeinated beverages for at least 30 minutes before. Take your blood pressure before (not after) you eat. Sit comfortably with  your back supported and both feet on the floor (don't cross your legs). Elevate your arm to heart level on a table or a desk. Use the proper sized cuff. It should fit smoothly and snugly around your bare upper arm. There should be enough room to slip a fingertip under the cuff. The bottom edge of the cuff should be 1 inch above the crease of the elbow. Ideally, take 3 measurements at one sitting and record the average.

## 2023-08-11 NOTE — Telephone Encounter (Signed)
 Called pt, spoke with him about the order being corrected. No further questions at this time.

## 2023-08-26 ENCOUNTER — Encounter (HOSPITAL_COMMUNITY): Payer: 59

## 2023-08-28 ENCOUNTER — Ambulatory Visit: Payer: 59 | Admitting: Cardiology

## 2023-09-01 ENCOUNTER — Ambulatory Visit: Payer: 59

## 2023-09-11 DIAGNOSIS — H90A32 Mixed conductive and sensorineural hearing loss, unilateral, left ear with restricted hearing on the contralateral side: Secondary | ICD-10-CM | POA: Insufficient documentation

## 2023-09-11 DIAGNOSIS — H9313 Tinnitus, bilateral: Secondary | ICD-10-CM | POA: Insufficient documentation

## 2023-09-12 ENCOUNTER — Ambulatory Visit: Attending: Cardiology

## 2023-09-12 ENCOUNTER — Ambulatory Visit: Attending: Cardiology | Admitting: Cardiology

## 2023-09-12 ENCOUNTER — Encounter: Payer: Self-pay | Admitting: Cardiology

## 2023-09-12 VITALS — BP 130/72 | Ht 67.0 in | Wt 159.2 lb

## 2023-09-12 DIAGNOSIS — R0609 Other forms of dyspnea: Secondary | ICD-10-CM | POA: Diagnosis not present

## 2023-09-12 DIAGNOSIS — I471 Supraventricular tachycardia, unspecified: Secondary | ICD-10-CM

## 2023-09-12 DIAGNOSIS — R002 Palpitations: Secondary | ICD-10-CM | POA: Diagnosis not present

## 2023-09-12 DIAGNOSIS — I480 Paroxysmal atrial fibrillation: Secondary | ICD-10-CM | POA: Diagnosis not present

## 2023-09-12 DIAGNOSIS — E782 Mixed hyperlipidemia: Secondary | ICD-10-CM

## 2023-09-12 DIAGNOSIS — I10 Essential (primary) hypertension: Secondary | ICD-10-CM | POA: Diagnosis not present

## 2023-09-12 NOTE — Patient Instructions (Signed)
 Medication Instructions:  Your physician recommends that you continue on your current medications as directed. Please refer to the Current Medication list given to you today.  *If you need a refill on your cardiac medications before your next appointment, please call your pharmacy*   Lab Work: None Ordered If you have labs (blood work) drawn today and your tests are completely normal, you will receive your results only by: MyChart Message (if you have MyChart) OR A paper copy in the mail If you have any lab test that is abnormal or we need to change your treatment, we will call you to review the results.   Testing/Procedures:  WHY IS MY DOCTOR PRESCRIBING ZIO? The Zio system is proven and trusted by physicians to detect and diagnose irregular heart rhythms -- and has been prescribed to hundreds of thousands of patients.  The FDA has cleared the Zio system to monitor for many different kinds of irregular heart rhythms. In a study, physicians were able to reach a diagnosis 90% of the time with the Zio system1.  You can wear the Zio monitor -- a small, discreet, comfortable patch -- during your normal day-to-day activity, including while you sleep, shower, and exercise, while it records every single heartbeat for analysis.  1Barrett, P., et al. Comparison of 24 Hour Holter Monitoring Versus 14 Day Novel Adhesive Patch Electrocardiographic Monitoring. American Journal of Medicine, 2014.  ZIO VS. HOLTER MONITORING The Zio monitor can be comfortably worn for up to 14 days. Holter monitors can be worn for 24 to 48 hours, limiting the time to record any irregular heart rhythms you may have. Zio is able to capture data for the 51% of patients who have their first symptom-triggered arrhythmia after 48 hours.1  LIVE WITHOUT RESTRICTIONS The Zio ambulatory cardiac monitor is a small, unobtrusive, and water-resistant patch--you might even forget you're wearing it. The Zio monitor records and stores  every beat of your heart, whether you're sleeping, working out, or showering.    Your physician has requested that you have an echocardiogram. Echocardiography is a painless test that uses sound waves to create images of your heart. It provides your doctor with information about the size and shape of your heart and how well your heart's chambers and valves are working. This procedure takes approximately one hour. There are no restrictions for this procedure. Please do NOT wear cologne, perfume, aftershave, or lotions (deodorant is allowed). Please arrive 15 minutes prior to your appointment time.  Please note: We ask at that you not bring children with you during ultrasound (echo/ vascular) testing. Due to room size and safety concerns, children are not allowed in the ultrasound rooms during exams. Our front office staff cannot provide observation of children in our lobby area while testing is being conducted. An adult accompanying a patient to their appointment will only be allowed in the ultrasound room at the discretion of the ultrasound technician under special circumstances. We apologize for any inconvenience.    Follow-Up: At [facility], you and your health needs are our priority.  As part of our continuing mission to provide you with exceptional heart care, we have created designated Provider Care Teams.  These Care Teams include your primary Cardiologist (physician) and Advanced Practice Providers (APPs -  Physician Assistants and Nurse Practitioners) who all work together to provide you with the care you need, when you need it.  We recommend signing up for the patient portal called "MyChart".  Sign up information is provided on this  After Visit Summary.  MyChart is used to connect with patients for Virtual Visits (Telemedicine).  Patients are able to view lab/test results, encounter notes, upcoming appointments, etc.  Non-urgent messages can be sent to your provider as well.   To learn more  about what you can do with MyChart, go to [email or web address].    Your next appointment:   6 week(s)  The format for your next appointment:   In Person  Provider:   Gypsy Balsam, MD    Other Instructions NA

## 2023-09-12 NOTE — Progress Notes (Signed)
 Cardiology Office Note:    Date:  09/12/2023   ID:  DEITRICK FERRERI, DOB [birth year], MRN [id number]  PCP:  Daisy Floro, MD  Cardiologist:  Gypsy Balsam, MD    Referring MD: Daisy Floro, MD   Chief Complaint  Patient presents with   Irregular Heart Beat    History of Present Illness:    LJ MIYAMOTO is a 68 y.o. male past medical history significant for paroxysmal atrial fibrillation, and all his life he had 2 episode of atrial fibrillation first episode he woke up in the middle of night with palpitations.  Second episode happened when he was driving a car.  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  Additional problem palpitations he does have Kardia mobile device and record multiple episode of PVCs with some bigeminy and trigeminy.  When he does have bigeminy or trigeminy does make him feel very bad.  Usually for that situation he takes an extra dose of metoprolol titrate which seems to be helping.  He does not exercise on the regular basis, he did not notice any decrease in his ability to exercise recently.  Recently he did have viral infection since that time PVCs became more frequent and that resolved.  No dizziness no passing out but sometimes feels very weak and tired when he gets those episodes.  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   Atrial fibrillation (HCC)    Atrial fibrillation with RVR (HCC) 04/19/2019   Bilateral impacted cerumen 10/26/2020   Chest pain 02/13/2018   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 04/19/2019   Conductive hearing loss, bilateral 10/26/2020   Elevated BP without diagnosis of hypertension 05/21/2016    Essential hypertension    Eustachian tube disorder, bilateral 04/19/2019   Foreign body of right middle ear 10/26/2020   GERD (gastroesophageal reflux disease) 11/27/2014   H/O sinus surgery 01/30/2015   Hiatal hernia    History of peptic ulcer 01/30/2015   Hyperlipemia    Hypertension    Leucocytosis 04/19/2019   Mixed hyperlipidemia 02/29/2020   Palpitations    Paroxysmal atrial fibrillation (HCC) 11/27/2014   Formatting of this note might be different from the original. 11/28/14 admitted to [facility] in [place] with atrial fibrillation with RVR. He was started on IV cardizem. Converted spontaneously. troponins were negative. Echo was judged to be normal. The left and right atrial chamber size is normal. TSH normal. Formatting of this note might be different from the original. 11/27/2014 - new diagnosis Fo   Stomach ulcer    SVT (supraventricular tachycardia) (HCC)    hx of   Tympanic membrane perforation, left 10/26/2020    Past Surgical History:  Procedure Laterality Date   CHOLECYSTECTOMY  2015   EAR CANALOPLASTY  2014   KNEE SURGERY     arthroscopic left knee   SHOULDER SURGERY     bilateral   SPLENECTOMY      Current Medications: Current Meds  Medication Sig   ALPRAZolam (XANAX) 0.25 MG tablet Take 0.125 mg by mouth at bedtime as needed for anxiety.     ASPIRIN 81 PO Take 81 mg of amoxicillin  by mouth daily.   B Complex Vitamins (B COMPLEX-B12) TABS Take 1 tablet by mouth daily.   diphenhydrAMINE (BENADRYL) 25 mg capsule Take 25 mg by mouth at bedtime.   fluticasone (FLONASE) 50 MCG/ACT nasal spray Place 1 spray into the nose 2 (two) times daily.   Lactobacillus Rhamnosus, GG, (CULTURELLE) CAPS Take 1 capsule by mouth daily.   levalbuterol (XOPENEX HFA) 45 MCG/ACT inhaler Inhale 1 puff into the lungs as needed for wheezing.   losartan (COZAAR) 50 MG tablet Take 1 tablet (50 mg total) by mouth daily.   metoprolol succinate (TOPROL XL) 25 MG 24 hr tablet Take 25 mg (one tablet) in  the morning take 37.5 mg (one and a half tablet) at night (Patient taking differently: Take 25-37.5 mg by mouth See admin instructions. Take 25 mg (one tablet) in the morning take 37.5 mg (one and a half tablet) at night)   metoprolol tartrate (LOPRESSOR) 50 MG tablet Take 1 tablet (50 mg total) by mouth as needed. (Patient taking differently: Take 25 mg by mouth as needed (HR).)   Multiple Vitamin (MULTIVITAMIN) tablet Take 1 tablet by mouth daily.     omeprazole (PRILOSEC) 20 MG capsule Take 20 mg by mouth daily.   zolpidem (AMBIEN) 10 MG tablet Take 10 mg by mouth at bedtime as needed for sleep.       Allergies:   Azithromycin, Covid-19 (mrna) vaccine, and Erythromycin   Social History   Socioeconomic History   Marital status: Married    Spouse name: Not on file   Number of children: 3   Years of education: Not on file   Highest education level: Not on file  Occupational History   Occupation: nurse workman's comp  Tobacco Use   Smoking status: Never   Smokeless tobacco: Never  Vaping Use   Vaping status: Never Used  Substance and Sexual Activity   Alcohol use: No   Drug use: No   Sexual activity: Not on file  Other Topics Concern   Not on file  Social History Narrative   Not on file   Social Drivers of Health   Financial Resource Strain: Not on file  Food Insecurity: Low Risk  (05/16/2023)   Received from [facility]   Hunger Vital Sign    Worried About Running Out of Food in the Last Year: Never true    Ran Out of Food in the Last Year: Never true  Transportation Needs: No Transportation Needs (05/16/2023)   Received from [facility]    In the past 12 months, has lack of reliable transportation kept you from medical appointments, meetings, work or from getting things needed for daily living? : No  Physical Activity: Not on file  Stress: Not on file  Social Connections: Unknown (11/13/2021)   Received from [facility], [facility]   Social  Network    Social Network: Not on file     Family History: The patient's family history includes Hypertension in his father; Kidney cancer in his father; Lung cancer in his father. There is no history of Prostate cancer. ROS:   Please see the history of present illness.    All 14 point review of systems negative except as described per history of present illness  EKGs/Labs/Other Studies Reviewed:    EKG Interpretation Date/Time:  Friday September 12 2023 16:19:25 EDT Ventricular Rate:  61 PR Interval:  190 QRS Duration:  88 QT Interval:  422 QTC Calculation: 424 R Axis:  3  Text Interpretation: Normal sinus rhythm with sinus arrhythmia Normal ECG When compared with ECG of 31-Jul-2023 16:32, No significant change was found Confirmed by Gypsy Balsam [phone]) on 09/12/2023 4:55:11 PM    Recent Labs: No results found for requested labs within last 365 days.  Recent Lipid Panel    Component Value Date/Time   CHOL 196 08/26/2022 1215   TRIG 202 (H) 08/26/2022 1215   HDL 41 08/26/2022 1215   CHOLHDL 4.8 08/26/2022 1215   LDLCALC 119 (H) 08/26/2022 1215    Physical Exam:    VS:  BP 130/72 (BP Location: Right Arm, Patient Position: Sitting)   Ht 5\' 7"  (1.702 m)   Wt 159 lb 3.2 oz (72.2 kg)   SpO2 99%   BMI 24.93 kg/m     Wt Readings from Last 3 Encounters:  09/12/23 159 lb 3.2 oz (72.2 kg)  07/31/23 158 lb 9.6 oz (71.9 kg)  08/26/22 164 lb (74.4 kg)     GEN:  Well nourished, well developed in no acute distress HEENT: Normal NECK: No JVD; No carotid bruits LYMPHATICS: No lymphadenopathy CARDIAC: RRR, no murmurs, no rubs, no gallops RESPIRATORY:  Clear to auscultation without rales, wheezing or rhonchi  ABDOMEN: Soft, non-tender, non-distended MUSCULOSKELETAL:  No edema; No deformity  SKIN: Warm and dry LOWER EXTREMITIES: no swelling NEUROLOGIC:  Alert and oriented x 3 PSYCHIATRIC:  Normal affect   ASSESSMENT:    1. Essential hypertension   2. Palpitations    3. Dyspnea on exertion   4. Paroxysmal atrial fibrillation (HCC)   5. SVT (supraventricular tachycardia) (HCC)   6. Mixed hyperlipidemia    PLAN:    In order of problems listed above:  PVCs.  Will put Zio patch to see the burden of PVCs, also directed them in localization of those PVCs.  As a part evaluation echocardiogram will be done to make sure structurally heart is normal that he does have PVCs induced cardiomyopathy or vice versa he does not have cardiomyopathy that would provoke those PVCs.  In the meantime we will continue present dose of metoprolol.  In the future we may consider increasing dose of metoprolol if that is not enough we may consider using calcium channel blocker or maybe even antiarrhythmic medication like mexiletine. Paroxysmal atrial fibrillation only 2 episodes in his life.  He does have 300 mg of flecainide in the pocket.  Instruction was if he develop atrial fibrillation he takes 300 mg of flecainide and goes to the emergency room to make sure it safe for him to continue using this technique. Dyspnea on exertion echocardiogram will be done. Supraventricular tachycardia less concerning. Dyslipidemia he did have coronary CT angio done few years ago calcium score 0 normal coronaries Individual will continue discussion about exercises on the regular basis   Medication Adjustments/Labs and Tests Ordered: Current medicines are reviewed at length with the patient today.  Concerns regarding medicines are outlined above.  Orders Placed This Encounter  Procedures   LONG TERM MONITOR (3-14 DAYS)   EKG 12-Lead   ECHOCARDIOGRAM COMPLETE   Medication changes: No orders of the defined types were placed in this encounter.   Signed, Georgeanna Lea, MD, [facility] 09/12/2023 4:55 PM    [facility]

## 2023-09-24 ENCOUNTER — Ambulatory Visit: Attending: Cardiology

## 2023-09-24 DIAGNOSIS — R0609 Other forms of dyspnea: Secondary | ICD-10-CM | POA: Diagnosis not present

## 2023-09-24 LAB — ECHOCARDIOGRAM COMPLETE
P 1/2 time: 571 ms
S' Lateral: 3.2 cm

## 2023-09-25 ENCOUNTER — Telehealth: Payer: Self-pay

## 2023-09-25 NOTE — Telephone Encounter (Signed)
-----   Message from Gypsy Balsam sent at 09/25/2023  1:14 PM EDT ----- Echocardiogram showed preserved left ventricular ejection fraction mild mitral valve regurgitation, overall looks fine

## 2023-09-25 NOTE — Telephone Encounter (Signed)
 Patient notified through my chart.

## 2023-10-10 ENCOUNTER — Encounter: Payer: Self-pay | Admitting: Cardiology

## 2023-10-10 ENCOUNTER — Ambulatory Visit: Attending: Cardiology | Admitting: Cardiology

## 2023-10-10 VITALS — BP 134/76 | HR 67 | Ht 67.0 in | Wt 148.0 lb

## 2023-10-10 DIAGNOSIS — I493 Ventricular premature depolarization: Secondary | ICD-10-CM | POA: Insufficient documentation

## 2023-10-10 DIAGNOSIS — I10 Essential (primary) hypertension: Secondary | ICD-10-CM

## 2023-10-10 DIAGNOSIS — E782 Mixed hyperlipidemia: Secondary | ICD-10-CM

## 2023-10-10 DIAGNOSIS — I480 Paroxysmal atrial fibrillation: Secondary | ICD-10-CM

## 2023-10-10 NOTE — Patient Instructions (Signed)

## 2023-10-10 NOTE — Progress Notes (Signed)
 Copy Cardiology Office Note:    Date:  10/10/2023   ID:  Adrian Rose, DOB 07-08-1955, MRN [id number]  PCP:  Daisy Floro, MD  Cardiologist:  Gypsy Balsam, MD    Referring MD: Daisy Floro, MD   Chief Complaint  Patient presents with   Hypertension        Low HR   increase palpitations   Medication Management    D/c Metoprolol    History of Present Illness:    Adrian Rose is a 68 y.o. male past medical history significant for paroxysmal atrial fibrillation in his life he got 2 episode of atrial fibrillation since that time he is fine, he does carry have Kardia mobile monitor he is heart rate on the regular basis he is complain of having a lot of PVCs, Zio patch done showed a lot of PVCs but no episodes of atrial fibrillation, denies have any chest pain tightness squeezing pressure burning chest, described to have stressful situation at home however does not want to elaborate on that.  Because of stress he lost about 10 pounds.  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 11/28/14 admitted to [facility] in [place] with atrial fibrillation with RVR. He was started on IV cardizem. Converted spontaneously. troponins were negative. Echo was judged to be normal. The left and right atrial chamber size is normal. TSH normal. Formatting of this note might be different from the original. 11/27/2014 - new diagnosis Fo   Stomach ulcer    SVT (supraventricular tachycardia) (HCC)    hx of   Tympanic membrane perforation, left 10/26/2020    Past Surgical History:  Procedure Laterality Date   CHOLECYSTECTOMY  2015   EAR CANALOPLASTY  2014   KNEE SURGERY     arthroscopic left knee   SHOULDER SURGERY     bilateral   SPLENECTOMY      Current Medications: Current Meds  Medication Sig   ALPRAZolam (XANAX) 0.25 MG tablet Take 0.125 mg by mouth at bedtime as needed for anxiety.     ASPIRIN 81 PO Take 81 mg of amoxicillin by mouth daily.   B Complex Vitamins (B COMPLEX-B12) TABS Take 1 tablet by mouth daily.   diphenhydrAMINE (BENADRYL) 25 mg capsule Take 25 mg by mouth at bedtime.   fluticasone (FLONASE) 50 MCG/ACT nasal spray Place 1 spray into the nose 2 (two) times daily.  Lactobacillus Rhamnosus, GG, (CULTURELLE) CAPS Take 1 capsule by mouth daily.   levalbuterol (XOPENEX HFA) 45 MCG/ACT inhaler Inhale 1 puff into the lungs as needed for wheezing.   losartan (COZAAR) 50 MG tablet Take 1 tablet (50 mg total) by mouth daily.   metoprolol succinate (TOPROL XL) 25 MG 24 hr tablet Take 25 mg (one tablet) in the morning take 37.5 mg (one and a half tablet) at night (Patient taking differently: Take 25-37.5 mg by mouth See admin instructions. Take 25 mg (one tablet) in the morning take 37.5 mg (one and a half tablet) at night)   metoprolol tartrate (LOPRESSOR) 50 MG tablet Take 1 tablet (50 mg total) by mouth as needed. (Patient  taking differently: Take 25 mg by mouth as needed (HR).)   Multiple Vitamin (MULTIVITAMIN) tablet Take 1 tablet by mouth daily.     omeprazole (PRILOSEC) 20 MG capsule Take 20 mg by mouth daily.   zolpidem (AMBIEN) 10 MG tablet Take 10 mg by mouth at bedtime as needed for sleep.       Allergies:   Azithromycin, Covid-19 (mrna) vaccine, and Erythromycin   Social History   Socioeconomic History   Marital status: Married    Spouse name: Not on file   Number of children: 3   Years of education: Not on file   Highest education level: Not on file  Occupational History   Occupation: nurse workman's comp  Tobacco Use   Smoking status: Never   Smokeless tobacco: Never  Vaping Use   Vaping status: Never Used  Substance and Sexual Activity   Alcohol use: No   Drug use: No   Sexual activity: Not on file  Other Topics Concern   Not on file  Social History Narrative   Not on file   Social Drivers of Health   Financial Resource Strain: Not on file  Food Insecurity: Low Risk  (05/16/2023)   Received from [facility]   Hunger Vital Sign    Worried About Running Out of Food in the Last Year: Never true    Ran Out of Food in the Last Year: Never true  Transportation Needs: No Transportation Needs (05/16/2023)   Received from [facility]    In the past 12 months, has lack of reliable transportation kept you from medical appointments, meetings, work or from getting things needed for daily living? : No  Physical Activity: Not on file  Stress: Not on file  Social Connections: Unknown (11/13/2021)   Received from [facility], [facility]   Social Network    Social Network: Not on file     Family History: The patient's family history includes Hypertension in his father; Kidney cancer in his father; Lung cancer in his father. There is no history of Prostate cancer. ROS:   Please see the history of present illness.    All 14 point review of systems negative except  as described per history of present illness  EKGs/Labs/Other Studies Reviewed:    EKG Interpretation Date/Time:  Friday October 10 2023 08:15:29 EDT Ventricular Rate:  68 PR Interval:  196 QRS Duration:  86 QT Interval:  392 QTC Calculation: 416 R Axis:   18  Text Interpretation: Sinus rhythm with sinus arrhythmia with occasional Premature ventricular complexes Otherwise normal ECG When compared with ECG of 12-Sep-2023 16:19, Premature ventricular complexes are now Present Confirmed by Gypsy Balsam [phone]) on 10/10/2023 8:32:00 AM    Recent Labs: No results found for requested labs  within last 365 days.  Recent Lipid Panel    Component Value Date/Time   CHOL 196 08/26/2022 1215   TRIG 202 (H) 08/26/2022 1215   HDL 41 08/26/2022 1215   CHOLHDL 4.8 08/26/2022 1215   LDLCALC 119 (H) 08/26/2022 1215    Physical Exam:    VS:  BP 134/76 (BP Location: Right Arm, Patient Position: Sitting)   Pulse 67   Ht 5\' 7"  (1.702 m)   Wt 148 lb (67.1 kg)   SpO2 94%   BMI 23.18 kg/m     Wt Readings from Last 3 Encounters:  10/10/23 148 lb (67.1 kg)  09/12/23 159 lb 3.2 oz (72.2 kg)  07/31/23 158 lb 9.6 oz (71.9 kg)     GEN:  Well nourished, well developed in no acute distress HEENT: Normal NECK: No JVD; No carotid bruits LYMPHATICS: No lymphadenopathy CARDIAC: RRR, no murmurs, no rubs, no gallops RESPIRATORY:  Clear to auscultation without rales, wheezing or rhonchi  ABDOMEN: Soft, non-tender, non-distended MUSCULOSKELETAL:  No edema; No deformity  SKIN: Warm and dry LOWER EXTREMITIES: no swelling NEUROLOGIC:  Alert and oriented x 3 PSYCHIATRIC:  Normal affect   ASSESSMENT:    1. Essential hypertension   2. Paroxysmal atrial fibrillation (HCC)   3. Mixed hyperlipidemia   4. PVC's (premature ventricular contractions)    PLAN:    In order of problems listed above:  PVCs which are symptomatic.  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  He preferred to try slightly different regimen of metoprolol tartrate he would like to try metoprolol titrate to twice or 3 times a day of 25 mg.  Another concern he got was his heart rate being 47 but he was completely asymptomatic when it happened.  Will try that approach. Paroxysmal atrial fibrillation monitor did not show any.  The issue of anticoagulation is 67 does have a high blood pressure still his chest vasculature activity low.  Will continue monitoring. Mixed dyslipidemia, I did review K PN which show me his LDL 105 HDL 46 this is from last year acceptable we will continue present management we will consider risk stratification Procedure   Medication Adjustments/Labs and Tests Ordered: Current medicines are reviewed at length with the patient today.  Concerns regarding medicines are outlined above.  Orders Placed This Encounter  Procedures   EKG 12-Lead   Medication changes: No orders of the defined types were placed in this encounter.   Signed, Georgeanna Lea, MD, [facility] 10/10/2023 8:32 AM    [facility]

## 2023-10-24 ENCOUNTER — Ambulatory Visit: Admitting: Cardiology

## 2023-10-27 ENCOUNTER — Encounter: Payer: Self-pay | Admitting: Cardiology

## 2023-10-27 DIAGNOSIS — R002 Palpitations: Secondary | ICD-10-CM

## 2023-10-28 ENCOUNTER — Other Ambulatory Visit: Payer: Self-pay

## 2023-10-28 MED ORDER — METOPROLOL TARTRATE 50 MG PO TABS: 25.0000 mg | ORAL_TABLET | Freq: Three times a day (TID) | ORAL | 3 refills | Status: DC

## 2023-10-30 ENCOUNTER — Ambulatory Visit: Admitting: Cardiology

## 2023-11-13 ENCOUNTER — Ambulatory Visit: Payer: Self-pay

## 2023-11-17 ENCOUNTER — Encounter: Payer: Self-pay | Admitting: Cardiology

## 2023-11-19 ENCOUNTER — Telehealth: Payer: Self-pay

## 2023-11-19 NOTE — Telephone Encounter (Signed)
 Pt viewed monitor results on My Chart per Dr. Vanetta Shawl note. Routed to PCP.

## 2024-01-09 ENCOUNTER — Encounter: Payer: Self-pay | Admitting: Cardiology

## 2024-01-09 ENCOUNTER — Ambulatory Visit: Attending: Cardiology | Admitting: Cardiology

## 2024-01-09 VITALS — BP 126/80 | HR 56 | Ht 66.0 in | Wt 152.0 lb

## 2024-01-09 DIAGNOSIS — G4700 Insomnia, unspecified: Secondary | ICD-10-CM | POA: Insufficient documentation

## 2024-01-09 DIAGNOSIS — I10 Essential (primary) hypertension: Secondary | ICD-10-CM

## 2024-01-09 DIAGNOSIS — J309 Allergic rhinitis, unspecified: Secondary | ICD-10-CM | POA: Insufficient documentation

## 2024-01-09 DIAGNOSIS — I480 Paroxysmal atrial fibrillation: Secondary | ICD-10-CM

## 2024-01-09 DIAGNOSIS — E782 Mixed hyperlipidemia: Secondary | ICD-10-CM | POA: Diagnosis not present

## 2024-01-09 DIAGNOSIS — R202 Paresthesia of skin: Secondary | ICD-10-CM | POA: Insufficient documentation

## 2024-01-09 DIAGNOSIS — J4520 Mild intermittent asthma, uncomplicated: Secondary | ICD-10-CM | POA: Insufficient documentation

## 2024-01-09 DIAGNOSIS — I493 Ventricular premature depolarization: Secondary | ICD-10-CM

## 2024-01-09 DIAGNOSIS — R7989 Other specified abnormal findings of blood chemistry: Secondary | ICD-10-CM | POA: Insufficient documentation

## 2024-01-09 MED ORDER — METOPROLOL SUCCINATE ER 50 MG PO TB24: 75.0000 mg | ORAL_TABLET | Freq: Every day | ORAL | 3 refills | Status: AC

## 2024-01-09 NOTE — Patient Instructions (Addendum)
 Medication Instructions:   START: Metoprolol  Succinate 75mg  1 tablet daily   Lab Work: None Ordered If you have labs (blood work) drawn today and your tests are completely normal, you will receive your results only by: MyChart Message (if you have MyChart) OR A paper copy in the mail If you have any lab test that is abnormal or we need to change your treatment, we will call you to review the results.   Testing/Procedures: None Ordered   Follow-Up: At [facility], you and your health needs are our priority.  As part of our continuing mission to provide you with exceptional heart care, we have created designated Provider Care Teams.  These Care Teams include your primary Cardiologist (physician) and Advanced Practice Providers (APPs -  Physician Assistants and Nurse Practitioners) who all work together to provide you with the care you need, when you need it.  We recommend signing up for the patient portal called MyChart.  Sign up information is provided on this After Visit Summary.  MyChart is used to connect with patients for Virtual Visits (Telemedicine).  Patients are able to view lab/test results, encounter notes, upcoming appointments, etc.  Non-urgent messages can be sent to your provider as well.   To learn more about what you can do with MyChart, go to [email or web address].    Your next appointment:   6 month(s)  The format for your next appointment:   In Person  Provider:   Lamar Fitch, MD    Other Instructions NA

## 2024-01-09 NOTE — Addendum Note (Signed)
 Addended by: ARLOA PLANAS D on: 01/09/2024 04:58 PM   Modules accepted: Orders

## 2024-01-09 NOTE — Progress Notes (Signed)
 Cardiology Office Note:    Date:  01/09/2024   ID:  Adrian Rose, DOB [birth year], MRN [id number]  PCP:  Okey Carlin Redbird, MD  Cardiologist:  Lamar Fitch, MD    Referring MD: Okey Carlin Redbird, MD   Chief Complaint  Patient presents with   Follow-up    History of Present Illness:    Adrian Rose is a 68 y.o. male past medical history significant for paroxysmal atrial fibrillation, only 2 documented episodes CHADS2 vascular low, only 1, not anticoagulated, PVCs with bigeminy trigeminy, symptomatic, origin of PVCs from RVOT.  Comes today 2 months for follow-up doing well.  Takes 25 mg metoprolol  3 times daily.  Denies have any chest pain tightness squeezing pressure burning chest.  He did have stressful situation at home but that normalized and is better.  He lost all significant amount of weight had to discontinue his losartan  but now blood pressure seems to be very well-controlled without it.  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 11/28/14 admitted to [facility]  in [place] with atrial fibrillation with RVR. He was started on IV cardizem . Converted spontaneously. troponins were negative. Echo was judged to be normal. The left and right atrial chamber size is normal. TSH normal. Formatting of this note might be different from the original. 11/27/2014 - new diagnosis Fo   Stomach ulcer    SVT (supraventricular tachycardia) (HCC)    hx of   Tympanic membrane perforation, left 10/26/2020    Past Surgical History:  Procedure Laterality Date   CHOLECYSTECTOMY  2015   EAR CANALOPLASTY  2014   KNEE SURGERY     arthroscopic left knee   SHOULDER SURGERY     bilateral   SPLENECTOMY      Current Medications: Current Meds  Medication Sig   ALPRAZolam  (XANAX ) 0.25 MG tablet Take 0.125 mg by mouth at bedtime as needed for anxiety.     ASPIRIN  81 PO Take 81 mg of amoxicillin by mouth daily.   B Complex Vitamins (B COMPLEX-B12) TABS Take 1 tablet by mouth daily.   diphenhydrAMINE (BENADRYL) 25 mg capsule Take 25 mg by mouth at bedtime.   fluticasone  (FLONASE ) 50 MCG/ACT nasal spray Place 1 spray into the nose 2 (two) times daily.   Lactobacillus Rhamnosus, GG, (CULTURELLE) CAPS Take 1 capsule by mouth daily.   levalbuterol (XOPENEX HFA) 45 MCG/ACT inhaler Inhale 1 puff into the lungs as needed for wheezing.   metoprolol  tartrate (LOPRESSOR ) 50 MG tablet Take 0.5 tablets (25 mg total) by mouth in the  morning, at noon, and at bedtime.   Multiple Vitamin (MULTIVITAMIN) tablet Take 1 tablet by mouth daily.     omeprazole (PRILOSEC) 20 MG capsule Take 20 mg by mouth daily.   zolpidem  (AMBIEN ) 10 MG tablet Take 10 mg by mouth at bedtime as needed for sleep.     [DISCONTINUED] losartan  (COZAAR ) 50 MG tablet Take 1 tablet (50 mg total) by mouth daily.   [DISCONTINUED] metoprolol  succinate (TOPROL  XL) 25 MG 24 hr tablet Take 25 mg (one tablet) in the morning take 37.5 mg (one and a half tablet) at night (Patient taking differently: Take 25-37.5 mg by mouth See admin  instructions. Take 25 mg (one tablet) in the morning take 37.5 mg (one and a half tablet) at night)     Allergies:   Azithromycin, Covid-19 (mrna) vaccine, and Erythromycin   Social History   Socioeconomic History   Marital status: Married    Spouse name: Not on file   Number of children: 3   Years of education: Not on file   Highest education level: Not on file  Occupational History   Occupation: nurse workman's comp  Tobacco Use   Smoking status: Never   Smokeless tobacco: Never  Vaping Use   Vaping status: Never Used  Substance and Sexual Activity   Alcohol use: No   Drug use: No   Sexual activity: Not on file  Other Topics Concern   Not on file  Social History Narrative   Not on file   Social Drivers of Health   Financial Resource Strain: Not on file  Food Insecurity: Low Risk  (11/14/2023)   Received from [facility]   Hunger Vital Sign    Within the past 12 months, you worried that your food would run out before you got money to buy more: Never true    Within the past 12 months, the food you bought just didn't last and you didn't have money to get more. : Never true  Transportation Needs: No Transportation Needs (11/14/2023)   Received from [facility]    In the past 12 months, has lack of reliable transportation kept you from medical appointments, meetings, work or from getting things needed for daily living? : No  Physical Activity: Not on file  Stress: Not on file  Social Connections: Unknown (11/13/2021)   Received from [facility]   Social Network    Social Network: Not on file     Family History: The patient's family history includes Hypertension in his father; Kidney cancer in his father; Lung cancer in his father. There is no history of Prostate cancer. ROS:   Please see the history of present illness.    All 14 point review of systems negative except as described per history of present illness  EKGs/Labs/Other Studies Reviewed:          Recent Labs: No results found for requested labs within last 365 days.  Recent Lipid Panel    Component Value Date/Time   CHOL 196 08/26/2022 1215   TRIG 202 (H) 08/26/2022 1215   HDL 41 08/26/2022 1215   CHOLHDL 4.8 08/26/2022 1215   LDLCALC 119 (H) 08/26/2022 1215    Physical Exam:    VS:  BP 126/80 (BP Location: Left Arm, Patient Position: Sitting)   Pulse (!) 56   Ht 5' 6 (1.676 m)   Wt 152 lb (68.9 kg)   SpO2 98%   BMI 24.53 kg/m     Wt Readings  from Last 3 Encounters:  01/09/24 152 lb (68.9 kg)  10/10/23 148 lb (67.1 kg)  09/12/23 159 lb 3.2 oz (72.2 kg)     GEN:  Well nourished, well developed in no acute distress HEENT: Normal NECK: No JVD; No carotid bruits LYMPHATICS: No lymphadenopathy CARDIAC: RRR, no murmurs, no rubs, no gallops RESPIRATORY:  Clear to auscultation without rales, wheezing or rhonchi  ABDOMEN: Soft, non-tender, non-distended MUSCULOSKELETAL:  No edema; No deformity  SKIN: Warm and dry LOWER EXTREMITIES: no swelling NEUROLOGIC:  Alert and oriented x 3 PSYCHIATRIC:  Normal affect   ASSESSMENT:    1. Paroxysmal atrial fibrillation (HCC)   2. Essential hypertension   3. PVC's (premature ventricular contractions)   4. Mixed hyperlipidemia    PLAN:    In order of problems listed above:  Paroxysmal atrial fibrillation no recurrences continue monitoring. Essential hypertension well-controlled continue present management. PVCs I will switch his metoprolol  from the Toprol  tartrate to metoprolol  succinate 75 mg daily. Mixed dyslipidemia LDL 105 HDL 46 acceptable level for now   Medication Adjustments/Labs and Tests Ordered: Current medicines are reviewed at length with the patient today.  Concerns regarding medicines are outlined above.  No orders of the defined types were placed in this encounter.  Medication changes: No orders of the defined types were placed in this encounter.   Signed, Lamar DOROTHA Fitch, MD,  [facility] 01/09/2024 4:50 PM    [facility]

## 2024-01-18 ENCOUNTER — Encounter (HOSPITAL_BASED_OUTPATIENT_CLINIC_OR_DEPARTMENT_OTHER): Payer: Self-pay

## 2024-01-18 ENCOUNTER — Ambulatory Visit (HOSPITAL_BASED_OUTPATIENT_CLINIC_OR_DEPARTMENT_OTHER)
Admission: EM | Admit: 2024-01-18 | Discharge: 2024-01-18 | Disposition: A | Discharge status: Home / Self Care | Attending: Family Medicine | Admitting: Family Medicine

## 2024-01-18 DIAGNOSIS — L0291 Cutaneous abscess, unspecified: Secondary | ICD-10-CM | POA: Diagnosis not present

## 2024-01-18 MED ORDER — CEPHALEXIN 500 MG PO CAPS: 500.0000 mg | ORAL_CAPSULE | Freq: Four times a day (QID) | ORAL | 0 refills | Status: DC

## 2024-01-18 NOTE — Discharge Instructions (Signed)
 Antibiotics as prescribed.  Recommend warm compresses to the area.  Follow-up with the surgeon as planned

## 2024-01-18 NOTE — ED Provider Notes (Signed)
 [facility]    CSN: [id number] Arrival date & time: 01/18/24  1309      History   Chief Complaint Chief Complaint  Patient presents with   Recurrent Skin Infections    HPI Adrian Rose is a 68 y.o. male.   68 year old male that presents with infected cyst to posterior neck. The cyst has been there for some time but never painful. No fever, chills.  Has appointment to see a surgeon in a little over a week. Has not done anything to treat the area.      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 11/28/14 admitted to [facility] in [place] with atrial fibrillation with RVR. He was started on IV cardizem . Converted spontaneously. troponins were negative. Echo was judged to be normal. The left and right atrial chamber size is normal. TSH normal. Formatting of this note might be different from the original. 11/27/2014 - new diagnosis Fo   Stomach ulcer    SVT (supraventricular tachycardia) (HCC)    hx of   Tympanic membrane perforation, left 10/26/2020    Patient  Active Problem List   Diagnosis Date Noted   Paresthesia 01/09/2024   Other specified abnormal findings of blood chemistry 01/09/2024   Mild intermittent asthma 01/09/2024   Insomnia 01/09/2024   Allergic rhinitis 01/09/2024   PVC's (premature ventricular contractions) 10/10/2023   Mixed conductive and sensorineural hearing loss of left ear with restricted hearing of right ear 09/11/2023   Tinnitus aurium, bilateral 09/11/2023   Cholesteatoma of ear, left 01/18/2022   Anxiety 01/11/2021   Hyperlipemia 01/11/2021   Hypertension 01/11/2021   Stomach ulcer 01/11/2021   Bilateral impacted cerumen 10/26/2020   Conductive hearing loss, bilateral 10/26/2020   Foreign body of right middle ear 10/26/2020   Tympanic membrane perforation, left 10/26/2020   Adhesive middle ear disease, bilateral 03/02/2020   Asymmetrical hearing loss 03/02/2020   Mixed hyperlipidemia 02/29/2020   Essential hypertension    Atrial fibrillation with RVR (HCC) 04/19/2019   Leucocytosis 04/19/2019   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 04/19/2019   Eustachian tube disorder, bilateral 04/19/2019   Chest pain 02/13/2018   Elevated BP without diagnosis of hypertension 05/21/2016   H/O sinus surgery 01/30/2015   History of peptic ulcer 01/30/2015   Atrial fibrillation (HCC)    GERD (gastroesophageal reflux disease) 11/27/2014   Paroxysmal atrial fibrillation (HCC) 11/27/2014   SVT (supraventricular tachycardia) (HCC)    Hiatal hernia    Arthritis    Palpitations  Past Surgical History:  Procedure Laterality Date   CHOLECYSTECTOMY  2015   EAR CANALOPLASTY  2014   KNEE SURGERY     arthroscopic left knee   SHOULDER SURGERY     bilateral   SPLENECTOMY         Home Medications    Prior to Admission medications   Medication Sig Start Date End Date Taking? Authorizing Provider  cephALEXin  (KEFLEX ) 500 MG capsule Take 1 capsule (500 mg total) by mouth 4 (four) times  daily. 01/18/24  Yes Markesia Crilly A, FNP  ALPRAZolam  (XANAX ) 0.25 MG tablet Take 0.125 mg by mouth at bedtime as needed for anxiety.      [provider]  ASPIRIN  81 PO Take 81 mg of amoxicillin by mouth daily.    [provider]  B Complex Vitamins (B COMPLEX-B12) TABS Take 1 tablet by mouth daily.    [provider]  diphenhydrAMINE (BENADRYL) 25 mg capsule Take 25 mg by mouth at bedtime.    [provider]  fluticasone  (FLONASE ) 50 MCG/ACT nasal spray Place 1 spray into the nose 2 (two) times daily.    [provider]  Lactobacillus Rhamnosus, GG, (CULTURELLE) CAPS Take 1 capsule by mouth daily.    [provider]  levalbuterol (XOPENEX HFA) 45 MCG/ACT inhaler Inhale 1 puff into the lungs as needed for wheezing.    [provider]  metoprolol  succinate (TOPROL -XL) 50 MG 24 hr tablet Take 1.5 tablets (75 mg total) by mouth daily. Take with or immediately following a meal. 01/09/24 04/08/24  Krasowski, Robert J, MD  Multiple Vitamin (MULTIVITAMIN) tablet Take 1 tablet by mouth daily.      [provider]  omeprazole (PRILOSEC) 20 MG capsule Take 20 mg by mouth daily. 02/10/19   [provider]  zolpidem  (AMBIEN ) 10 MG tablet Take 10 mg by mouth at bedtime as needed for sleep.      [provider]    Family History Family History  Problem Relation Age of Onset   Hypertension Father    Kidney cancer Father    Lung cancer Father    Prostate cancer Neg Hx     Social History Social History   Tobacco Use   Smoking status: Never   Smokeless tobacco: Never  Vaping Use   Vaping status: Never Used  Substance Use Topics   Alcohol use: No   Drug use: No     Allergies   Azithromycin, Covid-19 (mrna) vaccine, and Erythromycin   Review of Systems Review of Systems  See HPI Physical Exam Triage Vital Signs ED Triage Vitals  Encounter Vitals Group     BP 01/18/24 1323 (!) 146/84     Girls Systolic  BP Percentile --      Girls Diastolic BP Percentile --      Boys Systolic BP Percentile --      Boys Diastolic BP Percentile --      Pulse Rate 01/18/24 1323 (!) 50     Resp 01/18/24 1323 20     Temp 01/18/24 1323 98.3 F (36.8 C)     Temp Source 01/18/24 1323 Oral     SpO2 01/18/24 1323 97 %     Weight --      Height --      Head Circumference --      Peak Flow --      Pain Score 01/18/24 1325 3     Pain Loc --      Pain  Education --      Exclude from [employer] Chart --    No data found.  Updated Vital Signs BP (!) 146/84 (BP Location: Right Arm)   Pulse (!) 50   Temp 98.3 F (36.8 C) (Oral)   Resp 20   SpO2 97%   Visual Acuity Right Eye Distance:   Left Eye Distance:   Bilateral Distance:    Right Eye Near:   Left Eye Near:    Bilateral Near:     Physical Exam Constitutional:      Appearance: Normal appearance.  Neck:   Pulmonary:     Effort: Pulmonary effort is normal.  Musculoskeletal:        General: Normal range of motion.  Neurological:     Mental Status: He is alert.  Psychiatric:        Mood and Affect: Mood normal.      UC Treatments / Results  Labs (all labs ordered are listed, but only abnormal results are displayed) Labs Reviewed - No data to display  EKG   Radiology No results found.  Procedures Procedures (including critical care time)  Medications Ordered in UC Medications - No data to display  Initial Impression / Assessment and Plan / UC Course  I have reviewed the triage vital signs and the nursing notes.  Pertinent labs & imaging results that were available during my care of the patient were reviewed by me and considered in my medical decision making (see chart for details).     Abscess-no indication for I&D at this time.  This is a infected cyst.  Recommend follow-up with surgeon for better treatment of this.  I will go ahead and place on antibiotics for now due to potential infection.  Recommend warm compresses to the  area Follow-up as needed Final Clinical Impressions(s) / UC Diagnoses   Final diagnoses:  Abscess     Discharge Instructions      Antibiotics as prescribed.  Recommend warm compresses to the area.  Follow-up with the surgeon as planned    ED Prescriptions     Medication Sig Dispense Auth. Provider   cephALEXin  (KEFLEX ) 500 MG capsule Take 1 capsule (500 mg total) by mouth 4 (four) times daily. 28 capsule Adah Corning A, FNP      PDMP not reviewed this encounter.   Adah Corning LABOR, FNP 01/18/24 1354

## 2024-01-18 NOTE — ED Triage Notes (Signed)
 Patient presents for large raised area to left side of neck. States has had similar in the past but never this large.

## 2024-01-27 DIAGNOSIS — L723 Sebaceous cyst: Secondary | ICD-10-CM | POA: Insufficient documentation

## 2024-01-27 DIAGNOSIS — L0211 Cutaneous abscess of neck: Secondary | ICD-10-CM | POA: Insufficient documentation

## 2024-07-13 ENCOUNTER — Encounter: Payer: Self-pay | Admitting: *Deleted

## 2024-07-13 ENCOUNTER — Encounter: Payer: Self-pay | Admitting: Cardiology

## 2024-07-13 ENCOUNTER — Ambulatory Visit: Payer: Self-pay | Attending: Cardiology | Admitting: Cardiology

## 2024-07-13 VITALS — BP 134/82 | HR 60 | Ht 66.0 in | Wt 158.0 lb

## 2024-07-13 DIAGNOSIS — R0609 Other forms of dyspnea: Secondary | ICD-10-CM

## 2024-07-13 DIAGNOSIS — E782 Mixed hyperlipidemia: Secondary | ICD-10-CM | POA: Diagnosis not present

## 2024-07-13 DIAGNOSIS — I493 Ventricular premature depolarization: Secondary | ICD-10-CM | POA: Diagnosis not present

## 2024-07-13 DIAGNOSIS — Z8679 Personal history of other diseases of the circulatory system: Secondary | ICD-10-CM | POA: Insufficient documentation

## 2024-07-13 DIAGNOSIS — D75838 Other thrombocytosis: Secondary | ICD-10-CM | POA: Insufficient documentation

## 2024-07-13 DIAGNOSIS — I480 Paroxysmal atrial fibrillation: Secondary | ICD-10-CM | POA: Diagnosis not present

## 2024-07-13 NOTE — Patient Instructions (Addendum)
 Medication Instructions:  Your physician recommends that you continue on your current medications as directed. Please refer to the Current Medication list given to you today.  *If you need a refill on your cardiac medications before your next appointment, please call your pharmacy*   Lab Work: None Ordered If you have labs (blood work) drawn today and your tests are completely normal, you will receive your results only by: MyChart Message (if you have MyChart) OR A paper copy in the mail If you have any lab test that is abnormal or we need to change your treatment, we will call you to review the results.   Testing/Procedures: None   Follow-Up: At [facility], you and your health needs are our priority.  As part of our continuing mission to provide you with exceptional heart care, we have created designated Provider Care Teams.  These Care Teams include your primary Cardiologist (physician) and Advanced Practice Providers (APPs -  Physician Assistants and Nurse Practitioners) who all work together to provide you with the care you need, when you need it.  We recommend signing up for the patient portal called MyChart.  Sign up information is provided on this After Visit Summary.  MyChart is used to connect with patients for Virtual Visits (Telemedicine).  Patients are able to view lab/test results, encounter notes, upcoming appointments, etc.  Non-urgent messages can be sent to your provider as well.   To learn more about what you can do with MyChart, go to [email or web address].    Your next appointment:   6 month(s)  The format for your next appointment:   In Person  Provider:   Lamar Fitch, MD    Other Instructions NA

## 2024-07-13 NOTE — Progress Notes (Signed)
 Doing fine Cardiology Office Note:    Date:  07/13/2024   ID:  Adrian Rose, DOB Sep 27, 1955, MRN [id number]  PCP:  Okey Carlin Redbird, MD  Cardiologist:  Lamar Fitch, MD    Referring MD: Okey Carlin Redbird, MD   Chief Complaint  Patient presents with   Follow-up  Doing fine  History of Present Illness:    Adrian Rose is a 69 y.o. 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  Couple months ago he said he liked a lot of PVCs and he was contemplating ablation but things quiet down right now and he is happy where he is.  As a part of evaluation we did coronary CT angio in 2021 which demonstrated calcium  score 0, no coronary arteries disease.  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   Atrial fibrillation (HCC)    Atrial fibrillation with RVR (HCC) 04/19/2019   Bilateral impacted cerumen 10/26/2020   Chest pain 02/13/2018   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 04/19/2019   Conductive hearing loss, bilateral 10/26/2020   Elevated BP without diagnosis of hypertension 05/21/2016   Essential hypertension    Eustachian tube disorder, bilateral 04/19/2019   Foreign body of right middle ear 10/26/2020   GERD (gastroesophageal reflux disease) 11/27/2014   H/O sinus surgery 01/30/2015   Hiatal hernia    History of peptic ulcer 01/30/2015   Hyperlipemia    Hypertension    Leucocytosis 04/19/2019   Mixed hyperlipidemia 02/29/2020   Palpitations    Paroxysmal atrial fibrillation (HCC) 11/27/2014   Formatting of this note might be different from the  original. 11/28/14 admitted to [facility] in [place] with atrial fibrillation with RVR. He was started on IV cardizem . Converted spontaneously. troponins were negative. Echo was judged to be normal. The left and right atrial chamber size is normal. TSH normal. Formatting of this note might be different from the original. 11/27/2014 - new diagnosis Fo   Stomach ulcer    SVT (supraventricular tachycardia)    hx of   Tympanic membrane perforation, left 10/26/2020    Past Surgical History:  Procedure Laterality Date   CHOLECYSTECTOMY  2015   EAR CANALOPLASTY  2014   KNEE SURGERY     arthroscopic left knee   SHOULDER SURGERY     bilateral   SPLENECTOMY      Current Medications: Active Medications[1]   Allergies:   Azithromycin, Covid-19 (mrna) vaccine, and Erythromycin   Social History   Socioeconomic History   Marital status: Married    Spouse name: Not on file   Number of children: 3   Years of education: Not on file   Highest education level: Not on file  Occupational History   Occupation: nurse workman's comp  Tobacco Use   Smoking status: Never   Smokeless tobacco: Never  Vaping Use   Vaping status: Never Used  Substance and Sexual Activity   Alcohol use: No   Drug use: No   Sexual activity: Not on file  Other Topics Concern   Not on file  Social History Narrative   Not  on file   Social Drivers of Health   Tobacco Use: Low Risk (07/13/2024)   Patient History    Smoking Tobacco Use: Never    Smokeless Tobacco Use: Never    Passive Exposure: Not on file  Financial Resource Strain: Not on file  Food Insecurity: Low Risk (05/20/2024)   Received from [facility]   Epic    Within the past 12 months, you worried that your food would run out before you got money to buy more: Never true    Within the past 12 months, the food you bought just didn't last and you didn't have money to get more. : Never true  Transportation Needs: No Transportation Needs (05/20/2024)    Received from [facility]    In the past 12 months, has lack of reliable transportation kept you from medical appointments, meetings, work or from getting things needed for daily living? : No  Physical Activity: Not on file  Stress: Not on file  Social Connections: Unknown (11/13/2021)   Received from [facility]   Social Network    Social Network: Not on file  Depression (PHQ2-9): Not on file  Alcohol Screen: Not on file  Housing: Low Risk (05/20/2024)   Received from [facility]   Epic    What is your living situation today?: I have a steady place to live    Think about the place you live. Do you have problems with any of the following? Choose all that apply:: None/None on this list  Utilities: Low Risk (05/20/2024)   Received from [facility]   Utilities    In the past 12 months has the electric, gas, oil, or water company threatened to shut off services in your home? : No  Health Literacy: Not on file     Family History: The patient's family history includes Hypertension in his father; Kidney cancer in his father; Lung cancer in his father. There is no history of Prostate cancer. ROS:   Please see the history of present illness.    All 14 point review of systems negative except as described per history of present illness  EKGs/Labs/Other Studies Reviewed:         Recent Labs: No results found for requested labs within last 365 days.  Recent Lipid Panel    Component Value Date/Time   CHOL 196 08/26/2022 1215   TRIG 202 (H) 08/26/2022 1215   HDL 41 08/26/2022 1215   CHOLHDL 4.8 08/26/2022 1215   LDLCALC 119 (H) 08/26/2022 1215    Physical Exam:    VS:  BP 134/82   Pulse 60   Ht 5' 6 (1.676 m)   Wt 158 lb (71.7 kg)   SpO2 98%   BMI 25.50 kg/m     Wt Readings from Last 3 Encounters:  07/13/24 158 lb (71.7 kg)  01/09/24 152 lb (68.9 kg)  10/10/23 148 lb (67.1 kg)     GEN:  Well nourished, well developed in no acute distress HEENT:  Normal NECK: No JVD; No carotid bruits LYMPHATICS: No lymphadenopathy CARDIAC: RRR, no murmurs, no rubs, no gallops RESPIRATORY:  Clear to auscultation without rales, wheezing or rhonchi  ABDOMEN: Soft, non-tender, non-distended MUSCULOSKELETAL:  No edema; No deformity  SKIN: Warm and dry LOWER EXTREMITIES: no swelling NEUROLOGIC:  Alert and oriented x 3 PSYCHIATRIC:  Normal affect   ASSESSMENT:    1. Paroxysmal atrial fibrillation (HCC)   2. PVC's (premature ventricular contractions)   3. Mixed hyperlipidemia  PLAN:    In order of problems listed above:  Paroxysmal atrial fibrillation denies having episodes continue present management. PVCs did have frequent episode but that subsided.  Will continue present management I told him if this became more frequently mild a lot of things that we can do.  He is reluctant to take antiarrhythmic medication because of scare of potential proarrhythmia, however he talks about atrial fibrillation ablation. Mixed dyslipidemia I did review KPN which show me his LDL 105.  His calcium  score 0 normal coronaries based on coronary CT angiogram.  Continue monitoring   Medication Adjustments/Labs and Tests Ordered: Current medicines are reviewed at length with the patient today.  Concerns regarding medicines are outlined above.  No orders of the defined types were placed in this encounter.  Medication changes: No orders of the defined types were placed in this encounter.   Signed, Lamar DOROTHA Fitch, MD, [facility] 07/13/2024 4:46 PM    [facility]     [1]  Current Meds  Medication Sig   ALPRAZolam  (XANAX ) 0.25 MG tablet Take 0.125 mg by mouth at bedtime as needed for anxiety.     ASPIRIN  81 PO Take 81 mg of amoxicillin by mouth daily.   B Complex Vitamins (B COMPLEX-B12) TABS Take 1 tablet by mouth daily.   diphenhydrAMINE (BENADRYL) 25 mg capsule Take 25 mg by mouth at bedtime.   fluticasone  (FLONASE ) 50 MCG/ACT nasal  spray Place 1 spray into the nose 2 (two) times daily.   Lactobacillus Rhamnosus, GG, (CULTURELLE) CAPS Take 1 capsule by mouth daily.   metoprolol  succinate (TOPROL -XL) 50 MG 24 hr tablet Take 1.5 tablets (75 mg total) by mouth daily. Take with or immediately following a meal.   metoprolol  tartrate (LOPRESSOR ) 50 MG tablet Take 25 mg by mouth as needed (Elevated heart rate).   Multiple Vitamin (MULTIVITAMIN) tablet Take 1 tablet by mouth daily.     omeprazole (PRILOSEC) 20 MG capsule Take 20 mg by mouth daily.   zolpidem  (AMBIEN ) 10 MG tablet Take 10 mg by mouth at bedtime as needed for sleep.

## 2024-07-24 ENCOUNTER — Telehealth: Payer: Self-pay | Admitting: Internal Medicine

## 2024-07-24 NOTE — Telephone Encounter (Signed)
 Reports new onset atrial fibrillation this morning upon waking up with fatigue and dry mouth. Takes Metoprolol  succinate 75 mg daily. HR 150's and BP 140's/90's. Sinus rhythm rate is typically 60's. Took 25 mg metoprolol  tartrate when he realized he was in AF. He rates symptoms as a 1/10 on the bothersome scale.   Recommended that he wait 30 minutes. If still in persistent AF with rate >110 at rest he should take metoprolol  succinate.   I will leave a message with day team to have him on the schedule for EP follow up this week to address any ongoing rhythm issues and/or cardioversion. He can continue taking metoprolol  tartrate 25 mg every 6 hours if his rate is above 110.

## 2024-07-28 ENCOUNTER — Encounter: Payer: Self-pay | Admitting: Cardiology
